# Patient Record
Sex: Female | Born: 1980 | Race: Black or African American | Hispanic: No | Marital: Single | Smoking: Never smoker
Health system: Southern US, Community
[De-identification: ages and names within clinical notes are randomized; demographics above are authoritative.]

## PROBLEM LIST (undated history)

## (undated) DIAGNOSIS — J302 Other seasonal allergic rhinitis: Secondary | ICD-10-CM

## (undated) DIAGNOSIS — M1990 Unspecified osteoarthritis, unspecified site: Secondary | ICD-10-CM

## (undated) HISTORY — PX: SHOULDER SURGERY: SHX246

---

## 1999-01-16 ENCOUNTER — Emergency Department (HOSPITAL_COMMUNITY): Admission: EM | Admit: 1999-01-16 | Discharge: 1999-01-16 | Payer: Self-pay | Admitting: Emergency Medicine

## 1999-06-11 ENCOUNTER — Emergency Department (HOSPITAL_COMMUNITY): Admission: EM | Admit: 1999-06-11 | Discharge: 1999-06-11 | Payer: Self-pay | Admitting: Emergency Medicine

## 1999-07-07 ENCOUNTER — Emergency Department (HOSPITAL_COMMUNITY): Admission: EM | Admit: 1999-07-07 | Discharge: 1999-07-07 | Payer: Self-pay | Admitting: *Deleted

## 2000-08-19 ENCOUNTER — Emergency Department (HOSPITAL_COMMUNITY): Admission: EM | Admit: 2000-08-19 | Discharge: 2000-08-20 | Payer: Self-pay | Admitting: Emergency Medicine

## 2000-08-19 ENCOUNTER — Encounter: Payer: Self-pay | Admitting: Internal Medicine

## 2000-11-26 ENCOUNTER — Emergency Department (HOSPITAL_COMMUNITY): Admission: EM | Admit: 2000-11-26 | Discharge: 2000-11-26 | Payer: Self-pay | Admitting: Emergency Medicine

## 2000-12-21 ENCOUNTER — Ambulatory Visit (HOSPITAL_BASED_OUTPATIENT_CLINIC_OR_DEPARTMENT_OTHER): Admission: RE | Admit: 2000-12-21 | Discharge: 2000-12-22 | Payer: Self-pay | Admitting: Orthopaedic Surgery

## 2001-03-07 ENCOUNTER — Encounter: Admission: RE | Admit: 2001-03-07 | Discharge: 2001-03-23 | Payer: Self-pay | Admitting: Orthopaedic Surgery

## 2001-05-18 ENCOUNTER — Emergency Department (HOSPITAL_COMMUNITY): Admission: EM | Admit: 2001-05-18 | Discharge: 2001-05-18 | Payer: Self-pay | Admitting: Emergency Medicine

## 2001-05-18 ENCOUNTER — Encounter: Payer: Self-pay | Admitting: Emergency Medicine

## 2001-07-29 ENCOUNTER — Encounter: Admission: RE | Admit: 2001-07-29 | Discharge: 2001-08-22 | Payer: Self-pay | Admitting: Orthopaedic Surgery

## 2001-08-10 ENCOUNTER — Other Ambulatory Visit: Admission: RE | Admit: 2001-08-10 | Discharge: 2001-08-10 | Payer: Self-pay | Admitting: Obstetrics and Gynecology

## 2001-09-06 ENCOUNTER — Encounter: Admission: RE | Admit: 2001-09-06 | Discharge: 2001-09-06 | Payer: Self-pay | Admitting: Nephrology

## 2001-09-06 ENCOUNTER — Encounter: Payer: Self-pay | Admitting: Nephrology

## 2001-10-11 ENCOUNTER — Emergency Department (HOSPITAL_COMMUNITY): Admission: EM | Admit: 2001-10-11 | Discharge: 2001-10-11 | Payer: Self-pay | Admitting: Emergency Medicine

## 2001-11-08 ENCOUNTER — Emergency Department (HOSPITAL_COMMUNITY): Admission: EM | Admit: 2001-11-08 | Discharge: 2001-11-09 | Payer: Self-pay | Admitting: Emergency Medicine

## 2001-11-08 ENCOUNTER — Encounter: Payer: Self-pay | Admitting: *Deleted

## 2002-05-29 ENCOUNTER — Emergency Department (HOSPITAL_COMMUNITY): Admission: EM | Admit: 2002-05-29 | Discharge: 2002-05-29 | Payer: Self-pay | Admitting: Emergency Medicine

## 2002-05-29 ENCOUNTER — Encounter: Payer: Self-pay | Admitting: Emergency Medicine

## 2002-06-28 ENCOUNTER — Emergency Department (HOSPITAL_COMMUNITY): Admission: EM | Admit: 2002-06-28 | Discharge: 2002-06-29 | Payer: Self-pay | Admitting: Emergency Medicine

## 2002-06-28 ENCOUNTER — Encounter: Payer: Self-pay | Admitting: Emergency Medicine

## 2002-10-26 ENCOUNTER — Emergency Department (HOSPITAL_COMMUNITY): Admission: EM | Admit: 2002-10-26 | Discharge: 2002-10-26 | Payer: Self-pay | Admitting: Emergency Medicine

## 2002-11-24 ENCOUNTER — Emergency Department (HOSPITAL_COMMUNITY): Admission: EM | Admit: 2002-11-24 | Discharge: 2002-11-24 | Payer: Self-pay | Admitting: Emergency Medicine

## 2003-09-11 ENCOUNTER — Emergency Department (HOSPITAL_COMMUNITY): Admission: EM | Admit: 2003-09-11 | Discharge: 2003-09-11 | Payer: Self-pay | Admitting: *Deleted

## 2003-11-30 ENCOUNTER — Emergency Department (HOSPITAL_COMMUNITY): Admission: EM | Admit: 2003-11-30 | Discharge: 2003-11-30 | Payer: Self-pay

## 2004-09-11 ENCOUNTER — Emergency Department (HOSPITAL_COMMUNITY): Admission: EM | Admit: 2004-09-11 | Discharge: 2004-09-11 | Payer: Self-pay | Admitting: Emergency Medicine

## 2004-11-04 ENCOUNTER — Emergency Department (HOSPITAL_COMMUNITY): Admission: EM | Admit: 2004-11-04 | Discharge: 2004-11-04 | Payer: Self-pay | Admitting: Emergency Medicine

## 2004-12-03 ENCOUNTER — Emergency Department (HOSPITAL_COMMUNITY): Admission: EM | Admit: 2004-12-03 | Discharge: 2004-12-03 | Payer: Self-pay | Admitting: Emergency Medicine

## 2005-12-16 ENCOUNTER — Emergency Department (HOSPITAL_COMMUNITY): Admission: EM | Admit: 2005-12-16 | Discharge: 2005-12-16 | Payer: Self-pay | Admitting: Emergency Medicine

## 2006-01-12 ENCOUNTER — Ambulatory Visit: Payer: Self-pay | Admitting: Gastroenterology

## 2006-02-05 ENCOUNTER — Ambulatory Visit: Payer: Self-pay | Admitting: Gastroenterology

## 2006-02-22 ENCOUNTER — Emergency Department (HOSPITAL_COMMUNITY): Admission: EM | Admit: 2006-02-22 | Discharge: 2006-02-22 | Payer: Self-pay | Admitting: Emergency Medicine

## 2006-05-24 ENCOUNTER — Inpatient Hospital Stay (HOSPITAL_COMMUNITY): Admission: AD | Admit: 2006-05-24 | Discharge: 2006-05-24 | Payer: Self-pay | Admitting: Obstetrics

## 2006-06-01 ENCOUNTER — Ambulatory Visit (HOSPITAL_COMMUNITY): Admission: RE | Admit: 2006-06-01 | Discharge: 2006-06-01 | Payer: Self-pay | Admitting: Obstetrics

## 2007-01-18 ENCOUNTER — Emergency Department (HOSPITAL_COMMUNITY): Admission: EM | Admit: 2007-01-18 | Discharge: 2007-01-18 | Payer: Self-pay | Admitting: Emergency Medicine

## 2007-02-25 ENCOUNTER — Emergency Department (HOSPITAL_COMMUNITY): Admission: EM | Admit: 2007-02-25 | Discharge: 2007-02-25 | Payer: Self-pay | Admitting: Emergency Medicine

## 2007-06-19 ENCOUNTER — Emergency Department (HOSPITAL_COMMUNITY): Admission: EM | Admit: 2007-06-19 | Discharge: 2007-06-19 | Payer: Self-pay | Admitting: Emergency Medicine

## 2007-07-20 ENCOUNTER — Emergency Department (HOSPITAL_COMMUNITY): Admission: EM | Admit: 2007-07-20 | Discharge: 2007-07-20 | Payer: Self-pay | Admitting: Emergency Medicine

## 2007-09-29 ENCOUNTER — Emergency Department (HOSPITAL_COMMUNITY): Admission: EM | Admit: 2007-09-29 | Discharge: 2007-09-29 | Payer: Self-pay | Admitting: Emergency Medicine

## 2007-10-16 ENCOUNTER — Inpatient Hospital Stay (HOSPITAL_COMMUNITY): Admission: AD | Admit: 2007-10-16 | Discharge: 2007-10-17 | Payer: Self-pay | Admitting: Obstetrics & Gynecology

## 2007-10-27 ENCOUNTER — Inpatient Hospital Stay (HOSPITAL_COMMUNITY): Admission: AD | Admit: 2007-10-27 | Discharge: 2007-10-30 | Payer: Self-pay | Admitting: Obstetrics

## 2008-01-02 ENCOUNTER — Ambulatory Visit (HOSPITAL_COMMUNITY): Admission: RE | Admit: 2008-01-02 | Discharge: 2008-01-02 | Payer: Self-pay | Admitting: Obstetrics

## 2008-03-14 ENCOUNTER — Inpatient Hospital Stay (HOSPITAL_COMMUNITY): Admission: AD | Admit: 2008-03-14 | Discharge: 2008-03-14 | Payer: Self-pay | Admitting: Obstetrics

## 2008-04-05 IMAGING — US US OB COMP LESS 14 WK
1 series · 14 of 28 positions shown · non-contrast
Comparison: None.

CLINICAL DATA: 10 weeks pregnant with bleeding for 2 hours.
 OBSTETRICAL ULTRASOUND <14 WKS AND TRANSVAGINAL OB US:
TECHNIQUE: Both transabdominal and transvaginal ultrasound examinations were performed for complete evaluation of the gestation as well as the maternal uterus, adnexal regions, and pelvic cul-de-sac.

[Series 1: us ob comp less 14 wk · 0.26mm/px · 14 of 38 slices shown]
[im 2/38]
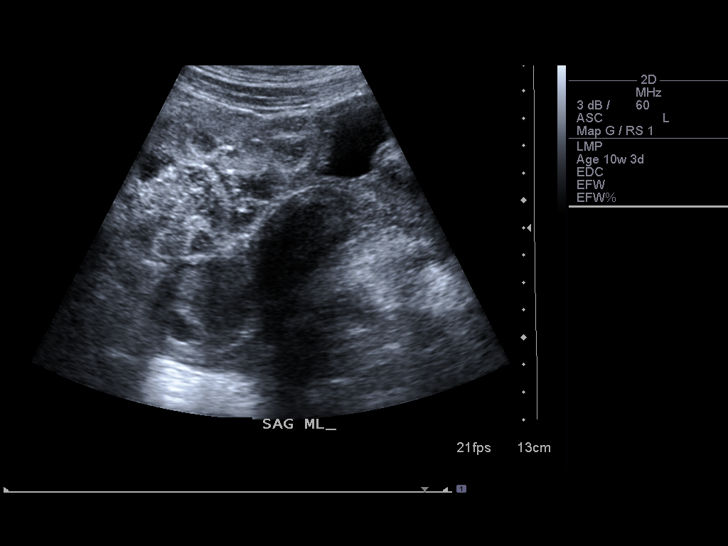
[im 5/38]
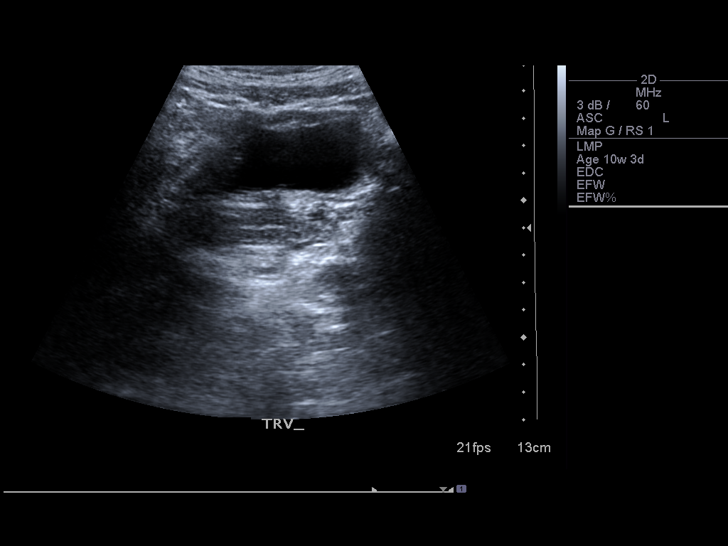
[im 7/38]
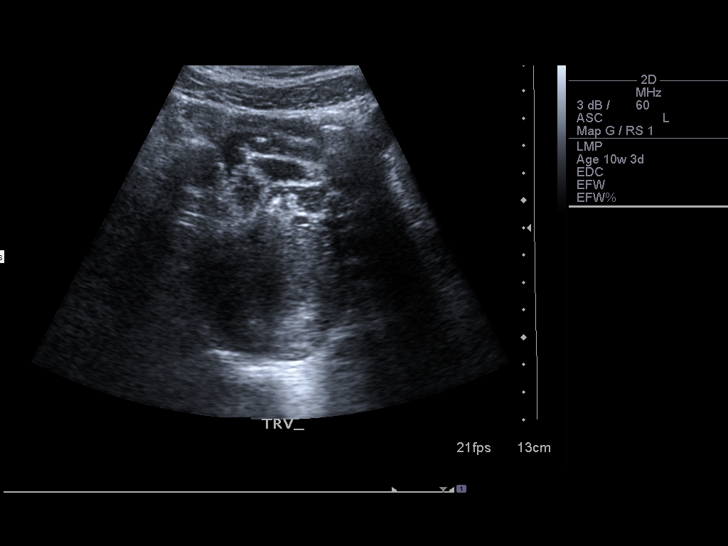
[im 10/38]
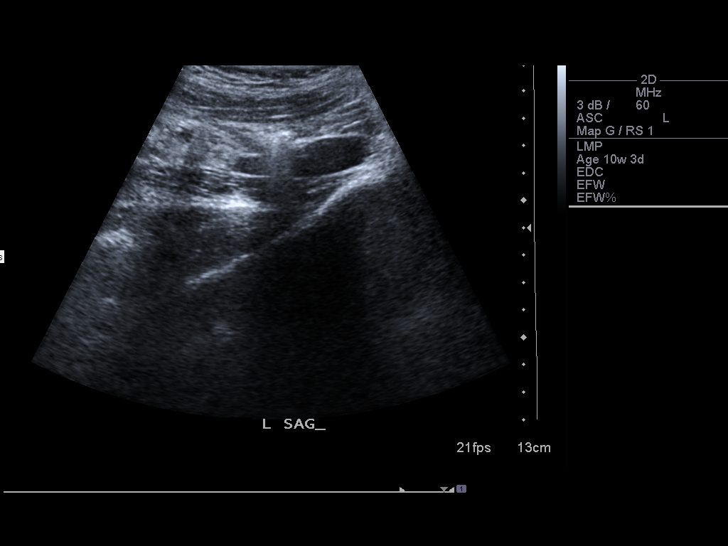
[im 13/38]
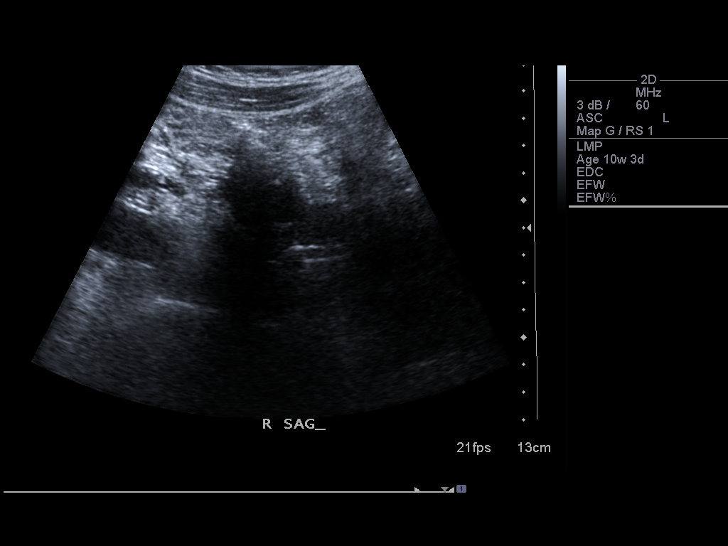
[im 16/38]
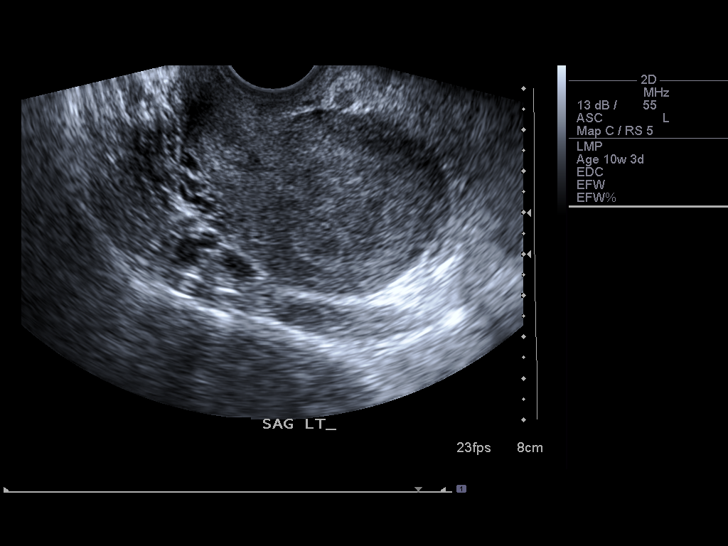
[im 18/38]
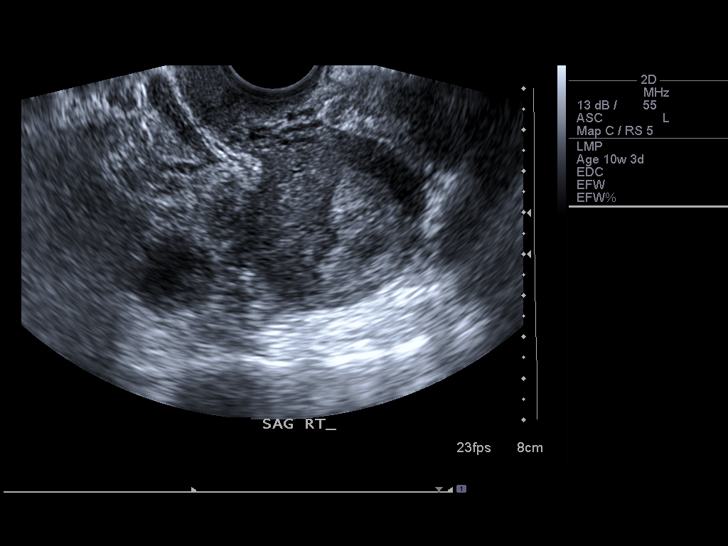
[im 21/38]
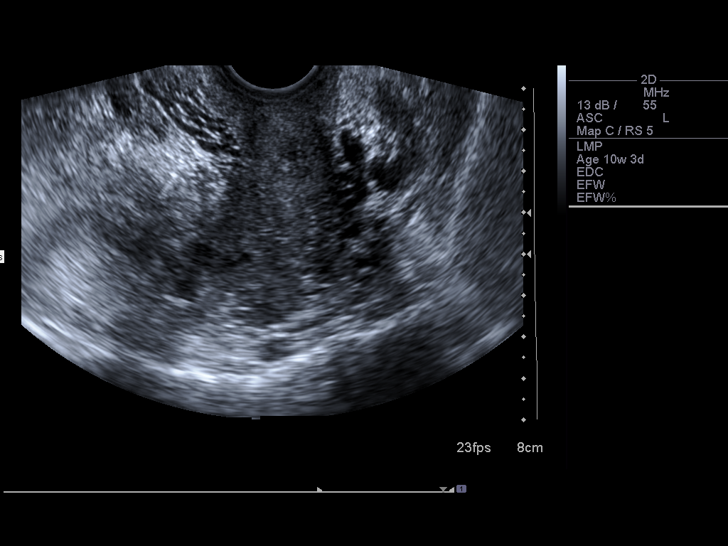
[im 24/38]
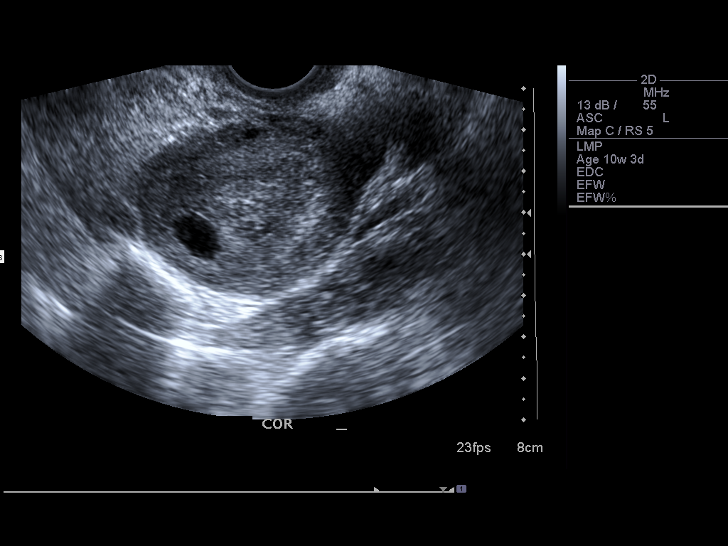
[im 27/38]
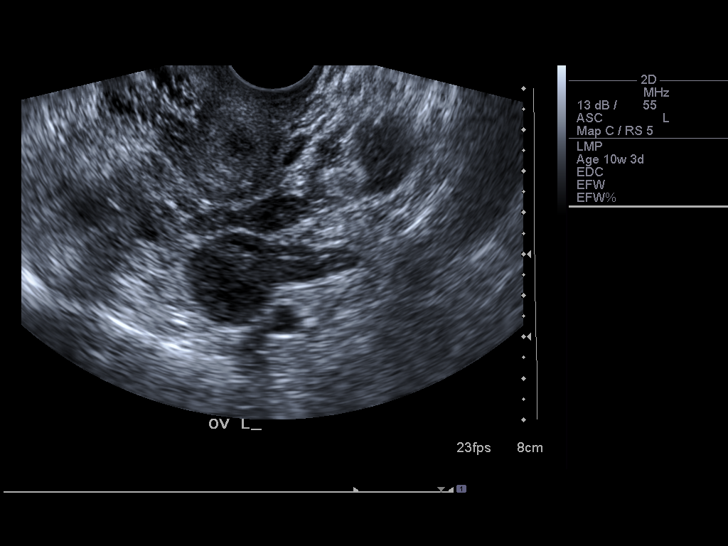
[im 29/38]
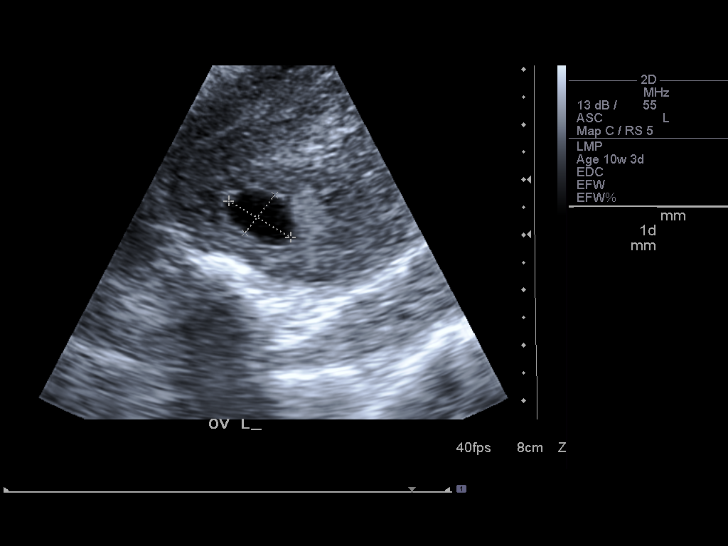
[im 32/38]
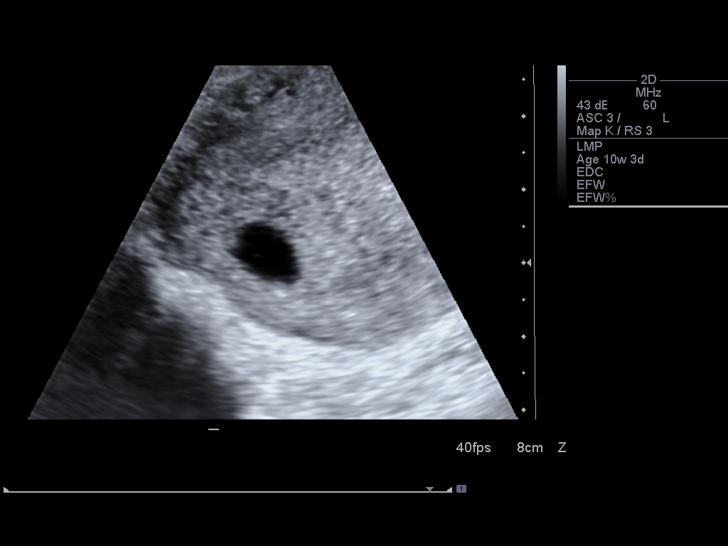
[im 35/38]
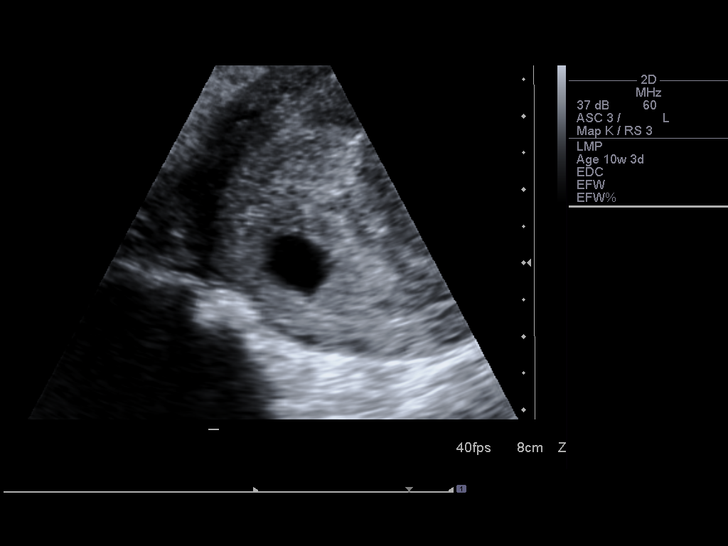
[im 38/38]
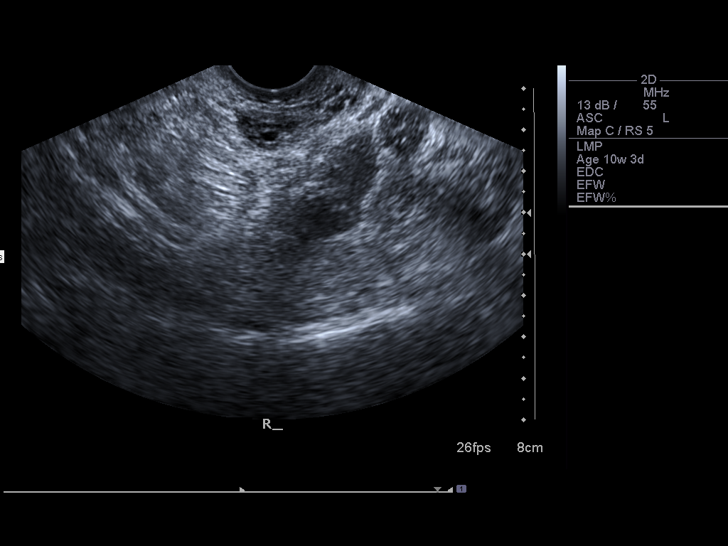

[14 of 28 positions shown; findings below may reference images not displayed]

FINDINGS: The uterus is retroverted but normal in size.  
 An endometrial fluid collection, likely representing a gestational sac is identified.  This measures a mean sac diameter of 10 mm, corresponding to 5 weeks 5 days.  No yolk sac or fetal pole is identified.  There is no significant subchorionic hemorrhage.  
 The right ovary is not identified.  The left ovary measures 3.6 x 2.1 x 1.8 cm and is normal in morphology.  
 No evidence of significant free fluid.
IMPRESSION: Probable gestational sac with a mean sac diameter of 10 mm corresponding to 5 weeks 5 days.  Recommend ultrasound and/or beta hCG follow-up.

## 2008-04-10 ENCOUNTER — Encounter: Payer: Self-pay | Admitting: Obstetrics & Gynecology

## 2008-04-10 ENCOUNTER — Inpatient Hospital Stay (HOSPITAL_COMMUNITY): Admission: AD | Admit: 2008-04-10 | Discharge: 2008-04-16 | Payer: Self-pay | Admitting: Obstetrics & Gynecology

## 2008-05-01 ENCOUNTER — Inpatient Hospital Stay (HOSPITAL_COMMUNITY): Admission: AD | Admit: 2008-05-01 | Discharge: 2008-05-01 | Payer: Self-pay | Admitting: Obstetrics & Gynecology

## 2008-05-07 ENCOUNTER — Inpatient Hospital Stay (HOSPITAL_COMMUNITY): Admission: AD | Admit: 2008-05-07 | Discharge: 2008-05-10 | Payer: Self-pay | Admitting: Obstetrics & Gynecology

## 2008-12-17 ENCOUNTER — Emergency Department (HOSPITAL_COMMUNITY): Admission: EM | Admit: 2008-12-17 | Discharge: 2008-12-17 | Payer: Self-pay | Admitting: Emergency Medicine

## 2009-08-26 ENCOUNTER — Inpatient Hospital Stay (HOSPITAL_COMMUNITY): Admission: AD | Admit: 2009-08-26 | Discharge: 2009-08-26 | Payer: Self-pay | Admitting: Obstetrics

## 2009-10-22 ENCOUNTER — Ambulatory Visit (HOSPITAL_COMMUNITY): Admission: RE | Admit: 2009-10-22 | Discharge: 2009-10-22 | Payer: Self-pay | Admitting: Obstetrics & Gynecology

## 2010-02-22 IMAGING — US US ABDOMEN COMPLETE
2 series · 13 of 25 positions shown · non-contrast
Comparison: None

CLINICAL DATA: Upper quadrant pain.  Nausea and vomiting

ABDOMEN ULTRASOUND
TECHNIQUE: Complete abdominal ultrasound examination was performed
including evaluation of the liver, gallbladder, bile ducts,
pancreas, kidneys, spleen, IVC, and abdominal aorta.

[Series 1: us abdomen complete · 12 of 112 slices shown (1 of 2)]
[im 1/112]
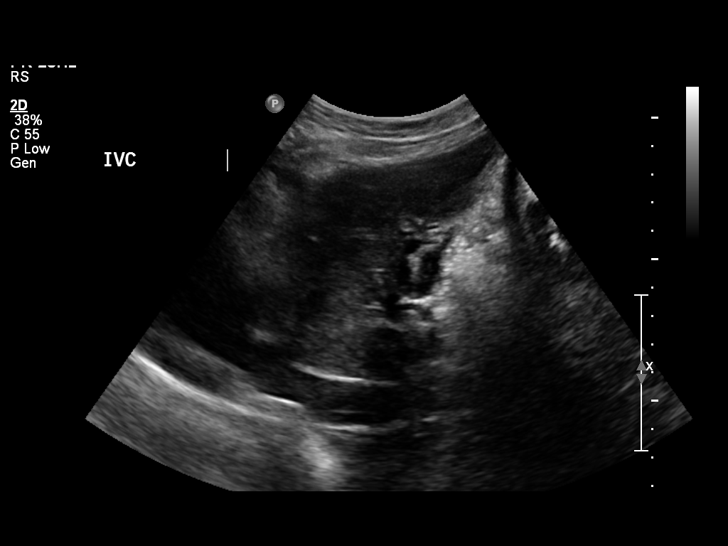
[im 10/112]
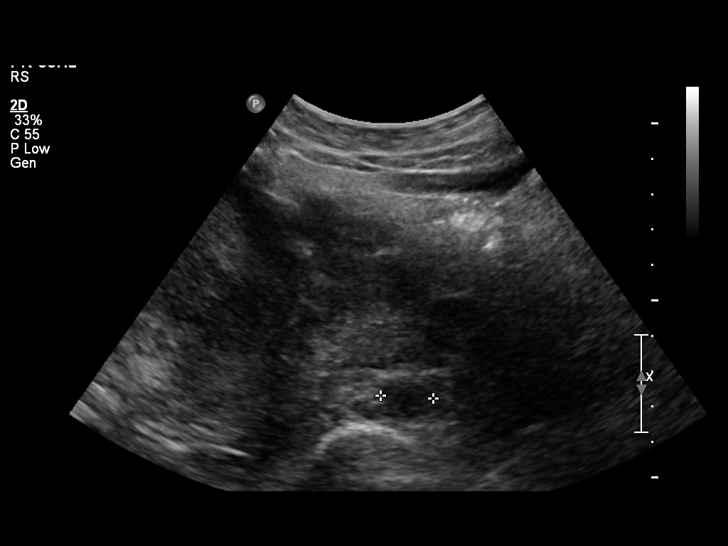
[im 20/112]
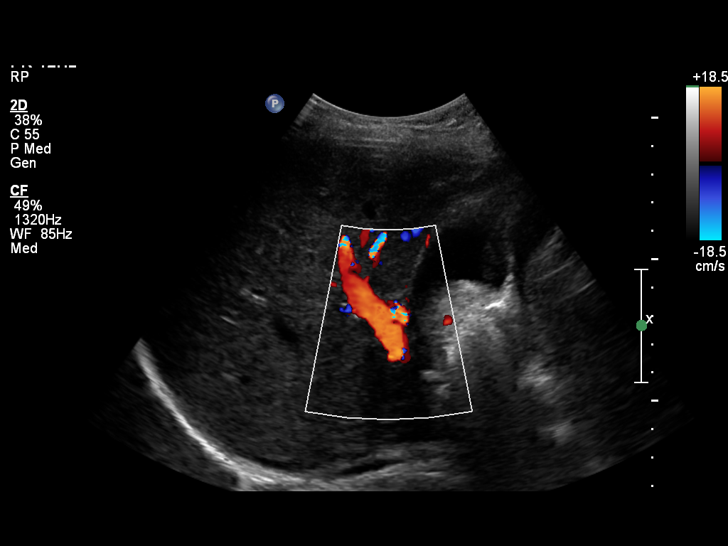
[im 29/112]
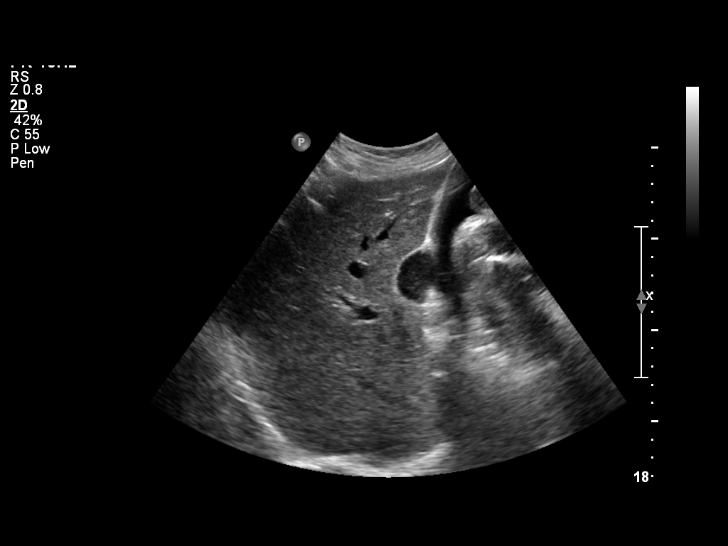
[im 39/112]
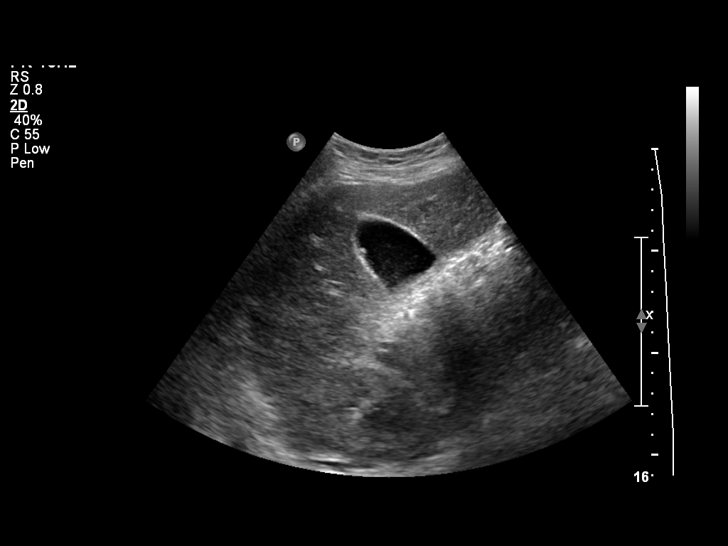
[im 49/112]
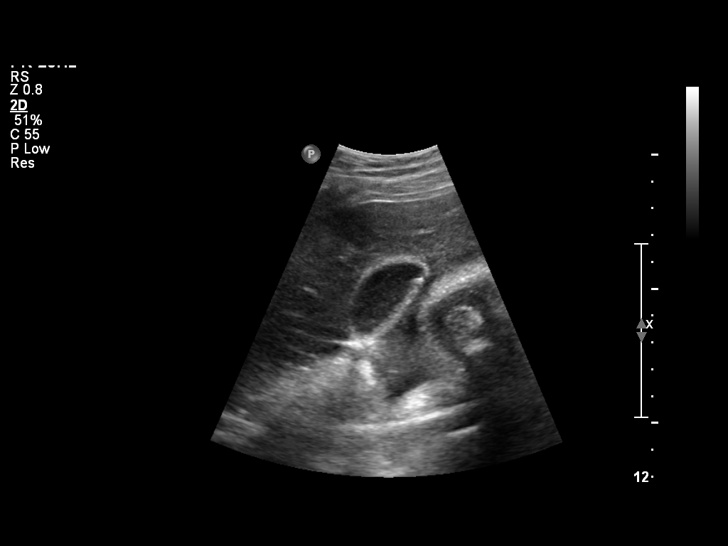
[im 58/112]
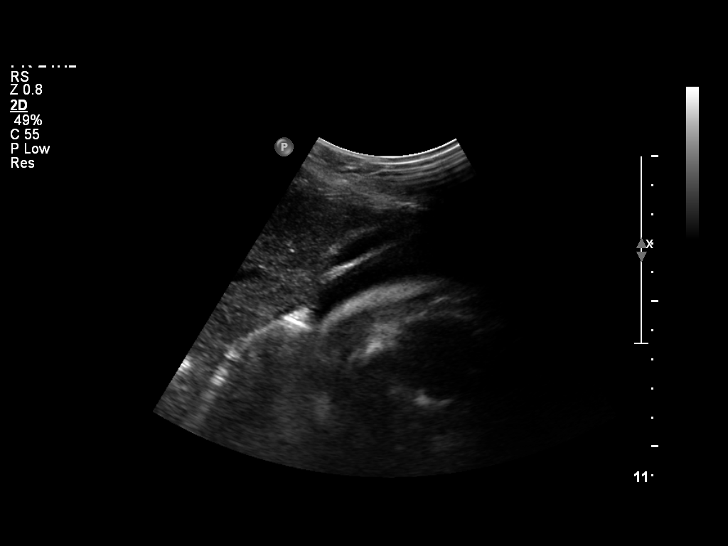
[im 68/112]
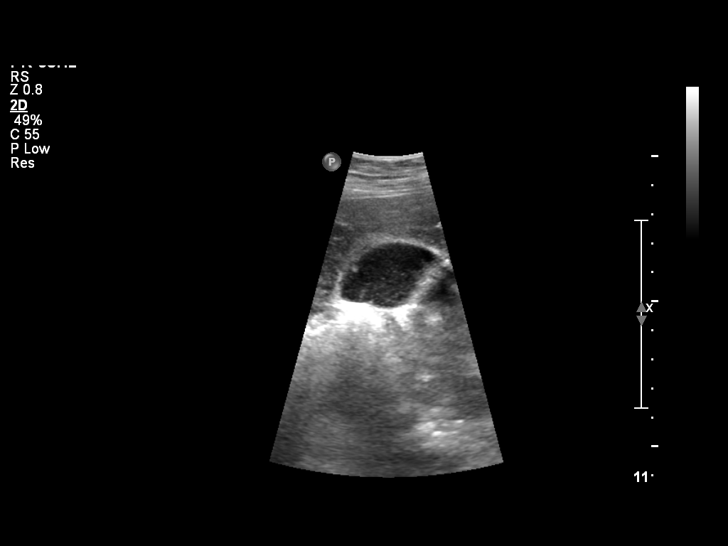
[im 78/112]
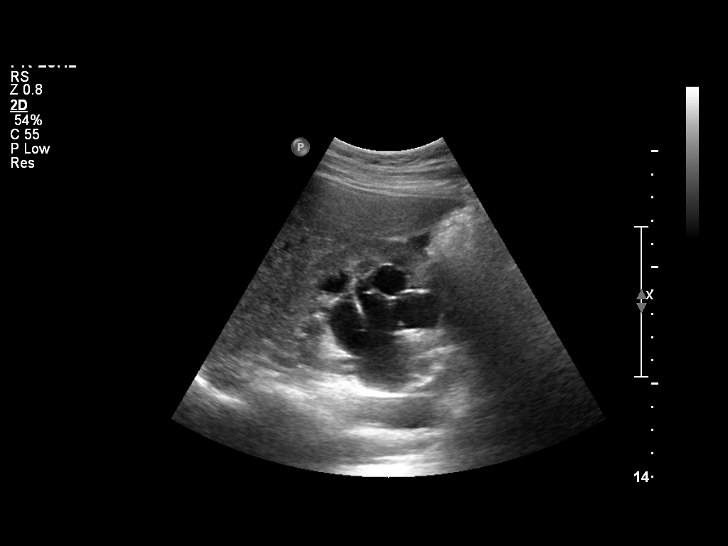
[im 87/112]
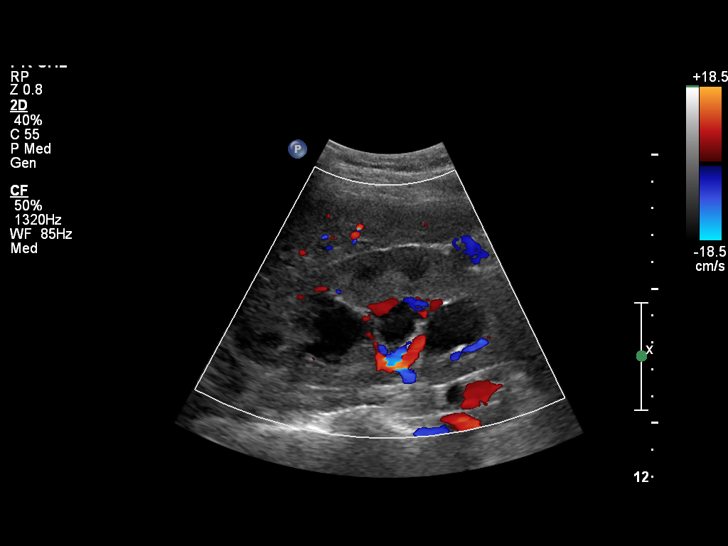
[im 97/112]
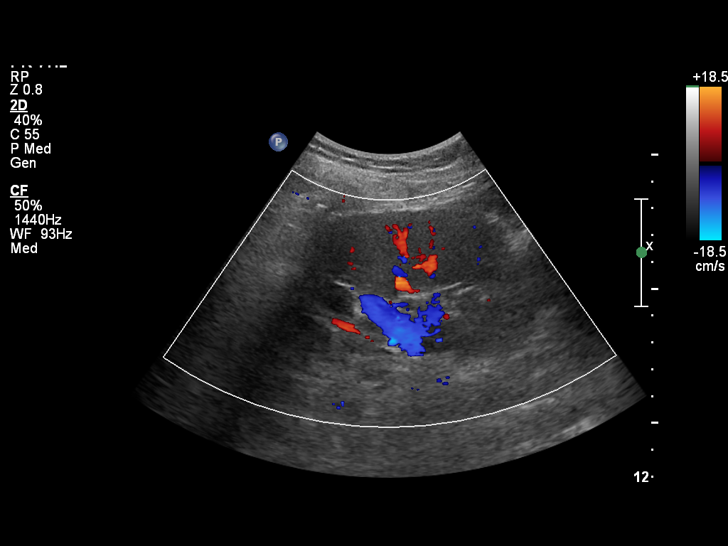
[im 107/112]
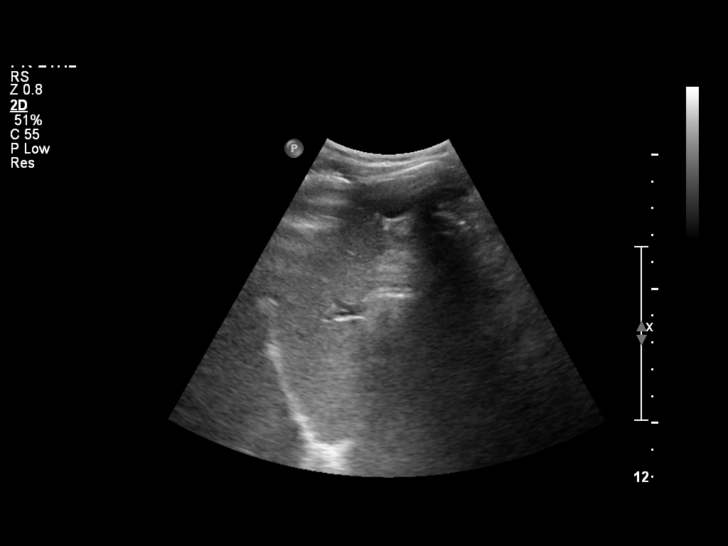

[Series 1: us abdomen complete · 1 of 5 slices shown (2 of 2)]
[im 1/5]
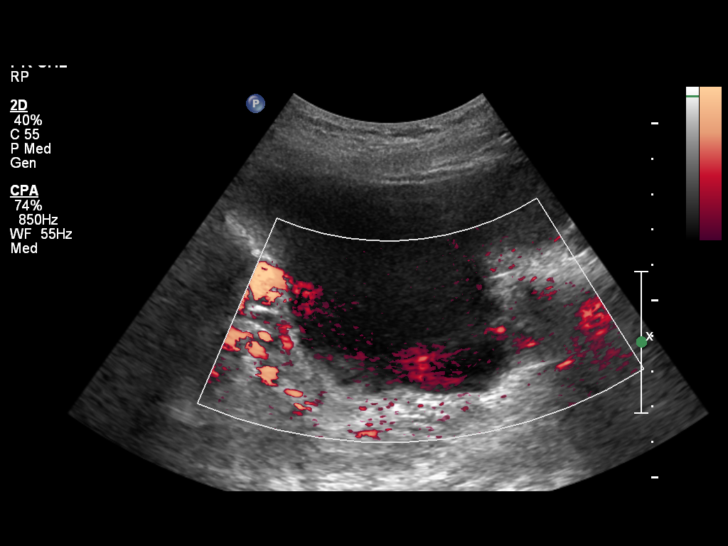

[13 of 25 positions shown; findings below may reference images not displayed]

FINDINGS: The liver parenchyma is homogeneous in echotexture
without evidence for focal parenchymal abnormality or intrahepatic
ductal dilatation.  The gallbladder is well distended and
demonstrates the presence of intraluminal sludge.  At least three
small echogenic polyps are identified, the largest measuring
mm.  The gallbladder wall is not thickened and no signs of
pericholecystic fluid are seen.  The common bile duct has a normal
AP width of 5 mm.

The pancreas is normal in size and echotexture.  The spleen is also
normal in size and echotexture.

The left kidney has a sagittal length of 13 cm and demonstrates
mild pyelectasis compatible the patient's current estimated
gestational age of 33 weeks.  The right kidney demonstrates a
sagittal length of 10.3 cm and demonstrates moderate
pelvicaliectasis.  Dilatation of the proximal aspect of the right
ureter is also seen.  Evaluation of the bladder reveals the
presence of bilateral ureteral jets and this would mitigate against
an obstructive process and suggests that this is related to
hydronephrosis of pregnancy.

The abdominal aorta has a normal appearance with a maximal caliber
of 1.8 cm and the proximal portion of the inferior vena cava is
unremarkable.  No intra-abdominal fluid is seen.
IMPRESSION: Gallbladder sludge with gallbladder polyps as described above.  No
cholelithiasis or signs of cholecystitis are seen sonographically.

Moderate right hydronephrosis with a right ureteral jet confirmed
suggesting hydronephrosis of pregnancy.

## 2010-03-22 ENCOUNTER — Inpatient Hospital Stay (HOSPITAL_COMMUNITY)
Admission: AD | Admit: 2010-03-22 | Discharge: 2010-03-25 | Payer: Self-pay | Source: Home / Self Care | Admitting: Obstetrics & Gynecology

## 2010-06-30 LAB — CBC
HCT: 35.3 % — ABNORMAL LOW (ref 36.0–46.0)
MCH: 29.3 pg (ref 26.0–34.0)
MCHC: 33.4 g/dL (ref 30.0–36.0)
Platelets: 215 10*3/uL (ref 150–400)
Platelets: 241 10*3/uL (ref 150–400)
RBC: 4.03 MIL/uL (ref 3.87–5.11)
RDW: 15.8 % — ABNORMAL HIGH (ref 11.5–15.5)

## 2010-06-30 LAB — RPR: RPR Ser Ql: NONREACTIVE

## 2010-07-08 LAB — URINE CULTURE: Colony Count: 90000

## 2010-07-08 LAB — URINALYSIS, ROUTINE W REFLEX MICROSCOPIC
Bilirubin Urine: NEGATIVE
Nitrite: NEGATIVE
Urobilinogen, UA: 0.2 mg/dL (ref 0.0–1.0)

## 2010-08-04 LAB — CBC
HCT: 31.1 % — ABNORMAL LOW (ref 36.0–46.0)
HCT: 32.3 % — ABNORMAL LOW (ref 36.0–46.0)
Hemoglobin: 10.4 g/dL — ABNORMAL LOW (ref 12.0–15.0)
MCHC: 33.3 g/dL (ref 30.0–36.0)
MCHC: 33.7 g/dL (ref 30.0–36.0)
MCV: 91.2 fL (ref 78.0–100.0)
Platelets: 225 10*3/uL (ref 150–400)
RBC: 3.41 MIL/uL — ABNORMAL LOW (ref 3.87–5.11)
RBC: 3.58 MIL/uL — ABNORMAL LOW (ref 3.87–5.11)
RDW: 14 % (ref 11.5–15.5)
WBC: 9.3 10*3/uL (ref 4.0–10.5)

## 2010-08-04 LAB — COMPREHENSIVE METABOLIC PANEL
AST: 146 U/L — ABNORMAL HIGH (ref 0–37)
Albumin: 2.8 g/dL — ABNORMAL LOW (ref 3.5–5.2)
Alkaline Phosphatase: 172 U/L — ABNORMAL HIGH (ref 39–117)
Alkaline Phosphatase: 225 U/L — ABNORMAL HIGH (ref 39–117)
CO2: 26 mEq/L (ref 19–32)
Calcium: 8.7 mg/dL (ref 8.4–10.5)
Calcium: 8.9 mg/dL (ref 8.4–10.5)
Creatinine, Ser: 0.98 mg/dL (ref 0.4–1.2)
Creatinine, Ser: 1.07 mg/dL (ref 0.4–1.2)
GFR calc non Af Amer: >60 mL/min (ref >60)
Glucose, Bld: 83 mg/dL (ref 70–99)
Potassium: 3.7 mEq/L (ref 3.5–5.1)
Potassium: 4.4 mEq/L (ref 3.5–5.1)
Total Protein: 5.9 g/dL — ABNORMAL LOW (ref 6.0–8.3)

## 2010-08-04 LAB — LACTATE DEHYDROGENASE: LDH: 195 U/L (ref 94–250)

## 2010-08-04 LAB — PROTIME-INR: Prothrombin Time: 12.7 seconds (ref 11.6–15.2)

## 2010-08-04 LAB — RPR: RPR Ser Ql: NONREACTIVE

## 2010-08-04 LAB — URIC ACID: Uric Acid, Serum: 6.1 mg/dL (ref 2.4–7.0)

## 2010-09-02 NOTE — H&P (Signed)
NAME:  Evelyn Olsen, Evelyn Olsen              ACCOUNT NO.:  [id number]   MEDICAL RECORD NO.:  [id number]         PATIENT TYPE:  WINP   LOCATION:                                FACILITY:  [facility]   PHYSICIAN:  Roseanna Rainbow, M.D.DATE OF BIRTH:  1980/10/29   DATE OF ADMISSION:  04/10/2008  DATE OF DISCHARGE:                              HISTORY & PHYSICAL   CHIEF COMPLAINT:  The patient is a 30 year old, para 0, with an  estimated date of confinement of June 01, 2008, with an intrauterine  pregnancy at 32 plus weeks with borderline elevated blood pressures,  nausea and vomiting, and elevated liver transaminases.   HISTORY OF PRESENT ILLNESS:  The patient's blood pressures have been  borderline elevated over the last several visits starting approximately  26-1/2 weeks with blood pressures in the 130s/80s range mostly.  There  was one blood pressure 140/70 on April 04, 2008.  She was recently  seen in the office yesterday and her blood pressure was 135/77.  On the  urine dip, there was no proteinuria.  The patient brought a 24-hour  urine collection and her creatinine clearance was 57; however, the  volume was low at 600 mL.  Her labs were remarkable for SGOT 102 and  SGPT 137 and this was trending upward from 1 week prior when there were  mild elevations in the liver transaminases.  Her hemoglobin was 10.2 and  platelets were 282 and uric acid was 6.7, creatinine was 0.86.  The  patient reports general malaise and nausea.  She also has constipation  and has not moved her bowels for over a week.  She had presented to the  Maternity Admissions Unit in late November with pregnancy-related nausea  and vomiting.   Allergies to PENICILLIN.   OB RISK FACTORS:  Please see the above.   PAST OBSTETRICAL HISTORY:  There is a history of a spontaneous abortion.   PRENATAL LABORATORY DATA:  Please see the above.  Chlamydia probe  negative.  Urine culture and sensitivity insignificant growth.  GC  probe  negative.  One-hour GCT 92.  Hepatitis B surface antigen negative.  Hematocrit 31.2, hemoglobin 10.2.  HIV nonreactive.  Quad screen  negative.  Platelets 262,000.  Blood type is O+, antibody screen  negative.  RPR nonreactive.  Rubella immune.  Sickle cell negative.  An  ultrasound on January 02, 2008, anterior placenta, no previa, normal  amniotic fluid index.  Biometry consistent with an 18-week 3-day  gestation with an [facility] of June 02, 2007, consistent with her last  menstrual period dating normal anatomic survey.   PAST GYNECOLOGIC HISTORY:  Noncontributory.   PAST MEDICAL HISTORY:  No significant history of medical diseases.   PAST SURGICAL HISTORY:  Questionable right upper extremity external  fixation.   SOCIAL HISTORY:  She is unemployed, single, does not give any  significant history of alcohol usage, has no significant smoking  history.  Denies illicit drug use.   FAMILY HISTORY:  Remarkable for hypertension.   REVIEW OF SYSTEMS:  GI:  Please see the above.  She denies any  epigastric  pain.  NEURO:  She denies any headache or visual  disturbances.  PULMONARY:  She denies any shortness of breath.   PHYSICAL EXAMINATION:  VITAL SIGNS:  Performed in the office on April 09, 2008, blood pressure 135/77.  GENERAL:  Well developed in no apparent distress.  ABDOMEN:  Gravid, nontender.  PELVIC:  Deferred.  EXTREMITIES:  Trace to 1+ lower extremity edema.   Doppler fetal heart tones 140.   ASSESSMENT:  Intrauterine pregnancy at 32 plus weeks, rule out severe  preeclampsia.  Differential diagnosis includes possible pregnancy-  related nausea and vomiting with dehydration contraction alkalosis, also  rule out hepatitis.   PLAN:  Admission, we will obtain a consultation from the [facility].  We will start magnesium sulfate for seizure prophylaxis.  We  will also begin a course of steroids.  We will check the 24-hour urine  protein result from the  office on April 09, 2008.  We will also  obtain PT/PTT and acute hepatitis panel.  We will also obtain an  abdominal ultrasound.      Roseanna Rainbow, M.D.  Electronically Signed     LAJ/MEDQ  D:  04/10/2008  T:  04/10/2008  Job:  [id number]

## 2010-09-02 NOTE — Discharge Summary (Signed)
NAME:  Evelyn Olsen, Evelyn Olsen              ACCOUNT NO.:  [id number]   MEDICAL RECORD NO.:  [id number]          PATIENT TYPE:  INP   LOCATION:  9317                          FACILITY:  [facility]   PHYSICIAN:  Charles A. Clearance Coots, M.D.DATE OF BIRTH:  October 08, 1980   DATE OF ADMISSION:  10/27/2007  DATE OF DISCHARGE:  10/30/2007                               DISCHARGE SUMMARY   ADMITTING DIAGNOSES:  8 weeks' gestation, hyperemesis gravidarum.   DISCHARGE DIAGNOSIS:  8 weeks' gestation, hyperemesis gravidarum.   Discharged home at 8 weeks' gestation, undelivered, much improved.  Discharged home in good condition.   REASON FOR ADMISSION:  A 30 year old black female G2, P0, presents with  nausea, vomiting and cramping.  The patient states that she cannot keep  anything down.  Last menstrual period on Aug 26, 2007.   PAST MEDICAL HISTORY:  Surgery: None.  Illness: None.   MEDICATIONS:  Zofran and Tylenol PM.   ALLERGIES:  PENICILLIN.   SOCIAL HISTORY:  Single.  Negative tobacco, alcohol or recreational drug  use.   FAMILY HISTORY:  No major illnesses report.   REVIEW OF SYSTEMS:  Positive for nausea and vomiting, positive for  abdominal cramping.   PHYSICAL EXAMINATION:  A well-nourished, well-developed female, in no  acute distress.  VITAL SIGNS:  Temperature 98, pulse 78, respiratory rate 18, and blood  pressure 136/87.  LUNGS:  Clear to auscultation bilaterally.  HEART:  Regular rate and rhythm.  ABDOMEN:  Nontender.  PELVIC:  Omitted.   ADMITTING LABS:  Hemoglobin 13, hematocrit 40, white blood cell count  11,000, platelets 237,000, sodium 133, potassium 4.7, BUN 13, and  creatinine 0.7.  Liver studies were within normal limits.  Urinalysis,  specific gravity 1.025, moderate hemoglobin, small leukocyte esterase,  many epithelial cells, urine culture was within normal limits.   HOSPITAL COURSE:  The patient was admitted and started on IV fluid  hydration and antiemetic therapy.  She  responded well to treatment and  slowly was able to advance diet, tolerating a regular diet on hospital  day #2.  The patient was discharged home on hospital day #3, tolerating  a regular diet.  No nausea and vomiting.   DISCHARGE DISPOSITION:  Medications, Phenergan 25 mg p.o. q.6 h. p.r.n.  Instructions were given for hyperemesis.  The patient is to call the  office for a followup appointment in 2 weeks.      Charles A. Clearance Coots, M.D.  Electronically Signed     CAH/MEDQ  D:  10/30/2007  T:  10/30/2007  Job:  [id number]

## 2010-09-05 NOTE — Discharge Summary (Signed)
NAME:  Evelyn Olsen, Evelyn Olsen              ACCOUNT NO.:  [id number]   MEDICAL RECORD NO.:  [id number]          PATIENT TYPE:  INP   LOCATION:  9156                          [facility]ACILITY:  [facility]   PHYSICIAN:  Charles A. Clearance Coots, M.D.DATE OF BIRTH:  12/15/1980   DATE OF ADMISSION:  04/10/2008  DATE OF DISCHARGE:  04/16/2008                               DISCHARGE SUMMARY   ADMITTING DIAGNOSES:  32 weeks' gestation, nausea, vomiting,  dehydration.   DISCHARGE DIAGNOSES:  32 weeks' gestation, nausea, vomiting,  dehydration, much improved after IV fluid hydration and antiemetic  therapy.  Discharged home undelivered at 32+ weeks' gestation in good  condition.   REASON FOR ADMISSION:  A 30 year old G1 at 32 weeks' gestation, admitted  due to elevated blood pressure and elevated liver enzymes with severe  nausea, vomiting, and inability to keep anything down.   PAST MEDICAL HISTORY:  SURGERY:  Right shoulder surgery.  ILLNESSES:  None.   MEDICATION:  Zofran and Flintstones vitamin.   ALLERGIES:  PENICILLIN which causes her hives.   SOCIAL HISTORY:  Single.  Negative tobacco, alcohol, or recreational  drug use.   FAMILY HISTORY:  Sister who is mentally challenged with speech and  language deficiency.   PHYSICAL EXAMINATION:  GENERAL:  Well-nourished, well-developed female  in no acute distress.  She is afebrile.  VITAL SIGNS:  Stable.  LUNGS:  Clear to auscultation bilaterally.  HEART:  Regular rate and rhythm.  ABDOMEN:  Gravid, nontender.  Cervical exam was omitted.   ADMITTING LABORATORY DATA:  Hemoglobin was 10, hematocrit 29, white  blood cell count 8900, platelets 243,000.  Comprehensive metabolic panel  was remarkable for AST of 159, ALT of 159.   HOSPITAL COURSE:  The patient was admitted and started on IV fluid  hydration and antiemetic therapy, bedrest.  Ultrasound of the abdomen  and a complete OB ultrasound were obtained and the ultrasound of the  abdomen was within normal  limits with estimated fetal weight at 75th  percentile and no gross anatomic features were noted.  The ultrasound of  the gallbladder revealed gallbladder polyps, no cholelithiasis or signs  of cholecystitis were seen.  The patient continued to respond well to  the therapy and she was ready for discharge on hospital day 5.  The  patient was therefore discharged home on hospital day 5 at 33 weeks'  gestation undelivered in good condition.   DISCHARGE LABORATORY DATA:  Hemoglobin 8.5, hematocrit 24, white blood  cell count 9200, platelets 205,000.   DISCHARGE DISPOSITION:  Medications:  Continue with multivitamins.  We  will try to incorporate iron in her regimen once she is able to tolerate  that.  Routine written instructions were given for discharge with  hyperemesis type of bland diet not eating any spicy foods or foods that  stimulate nausea.  The patient is to call the office for followup  appointment in 2 weeks.      Charles A. Clearance Coots, M.D.  Electronically Signed     CAH/MEDQ  D:  06/22/2008  T:  06/23/2008  Job:  [id number]

## 2010-09-05 NOTE — Op Note (Signed)
. [facility]  Patient:    Evelyn Olsen, Evelyn Olsen Visit Number: [id number] MRN: [id number]          Service Type: DSU Location: [facility] Attending Physician:  Marcene Corning Dictated by:   Lubertha Basque. Jerl Santos, M.D. Proc. Date: 12/21/00 Admit Date:  12/21/2000                             Operative Report  PREOPERATIVE DIAGNOSIS:  Right shoulder instability.  POSTOPERATIVE DIAGNOSIS:  Right shoulder instability.  OPERATION PERFORMED: 1. Right arthroscopic debridement. 2. Right open Bankart repair.  ANESTHESIA:  General.  ATTENDING SURGEON:  Lubertha Basque. Jerl Santos, M.D.  ASSISTANT:  Lindwood Qua, P.A.  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  The procedure was discussed with the patient and informed operative consent was obtained after discussion of possible complications of reaction to anesthesia, infection, neurovascular injury and recurrent dislocation.  DESCRIPTION OF PROCEDURE:  The patient was taken to an operating suite where general anesthetic was applied without difficulty.  She was then positioned supine and prepped and draped in normal sterile fashion.  After administration of preop intravenous antibiotics, an arthroscopy of the right shoulder was performed through a posterior portal.  There were no degenerative changes and the biceps tendon was well attached.  The glenohumeral joint was easily subluxable.  She had no labrum in the anterior aspect consistent with the chronicity of her condition.  The rotator cuff appeared benign on undersurface inspection.  It was felt that I was going to have to do a substantial shift along with her Bankart repair, so I elected to do this in an open fashion. The arthroscopic equipment was removed.  An incision was made on the  anterior aspect of the shoulder with dissection down to the deltopectoral groove.  The cephalic vein was taken with the deltoid in a lateral direction.  Dissection was carried down to the biceps conjoined tendon.  This was taken in a medial direction.  The subscapularis was isolated.  This was cut from the interval down to the level of the vessels which were not incised.  This was tagged with #2 Ethibond suture.  About 1 cm of tissue was left on the lesser tuberosity. At this point the arthroscopic fluid drained from the shoulder.  She had a large bare area on the anteroinferior aspect of her glenoid consistent with her instability.  This was freshened with a bur and rongeur.  I placed two of the minianchors from Arthrex set. These each had Ethibond suture attached. This suture was passed through the capsular tissue in order to make a new labrum.  This was tied down on the anteroinferior aspect of the glenoid at about 3 oclock and 5 oclock positions.  His shoulder then thoroughly irrigated with sterile saline.  I then repaired the subscapularis in a slightly imbricated fashion with the Ethibond suture.  Once this was accomplished she still had about 45 degrees of external rotation with her arm at about 45 degrees of abduction.  The wound was then irrigated followed by reapproximation of subcutaneous tissues with 2-0 undyed Vicryl and skin with nylon.  Steri-Strips were applied followed by Adaptic and dry gauze dressing with tape.  Estimated blood loss and intraoperative fluids can be obtained from Anesthesia records.  DISPOSITION:  The patient was extubated in the operating room and taken to the recovery room in stable condition.  Plans were for her to stay overnight for pain control with probable discharge home in the morning. Dictated by:   Lubertha Basque Jerl Santos, M.D. Attending Physician:  Marcene Corning DD:  12/21/00 TD:  12/21/00 Job: [id number]

## 2010-09-05 NOTE — Assessment & Plan Note (Signed)
[facility]                           GASTROENTEROLOGY OFFICE NOTE   Evelyn Olsen, Evelyn Olsen                       MRN:          [id number]  DATE:01/12/2006                            DOB:          13-Jan-1981    REASON FOR CONSULTATION:  1. Dysphagia.  2. Abdominal pain with constipation.   Evelyn Olsen is a pleasant 30 year old African American female, referred  through the courtesy of Dr. Clearance Coots for evaluation.  She complains of pyrosis  with dysphagia to solids.  She may develop discomfort in her upper abdomen  with her dysphagia.  She also complains of constipation.  She will go 3 to 4  days without a spontaneous bowel movement.  The stools are scant.  She has  abdominal bloating and general discomfort.  There is no history of melena or  hematochezia.   PAST MEDICAL HISTORY:  Unremarkable.   FAMILY HISTORY:  Noncontributory.   MEDICATIONS:  She is on no medications.   ALLERGIES:  PENICILLIN.   She neither smokes nor drinks.  She is single and unemployed.   REVIEW OF SYSTEMS:  Was reviewed and is negative.   PHYSICAL EXAMINATION:  Pulse 66, blood pressure 118/64, weight 168.  HEENT: EOMI. PERRLA. Sclerae are anicteric.  Conjunctivae are pink.  NECK:  Supple without thyromegaly, adenopathy or carotid bruits.  CHEST:  Clear to auscultation and percussion without adventitious sounds.  CARDIAC:  Regular rhythm; normal S1 S2.  There are no murmurs, gallops or  rubs.  ABDOMEN:  Bowel sounds are normoactive.  Abdomen is soft, non-tender and non-  distended.  There are no abdominal masses, tenderness, splenic enlargement  or hepatomegaly.  EXTREMITIES:  Full range of motion.  No cyanosis, clubbing or edema.  RECTAL:  Deferred.   IMPRESSION:  1. Dysphagia - rule out peptic esophageal stricture.  2. Gastroesophageal reflux disease.  3. Constipation.  This is most likely related to irritable bowel syndrome.   RECOMMENDATION:  1. Begin Prevacid  30 mg a day.  2. Upper endoscopy with Savary dilatation as indicated.  3. The patient will consider enrollment in an IBS/constipation trial.      Failing that, I would consider starting her on Lactulose and fiber.                                   Barbette Hair. Arlyce Dice, MD,FACG   RDK/MedQ  DD:  01/12/2006  DT:  01/14/2006  Job #:  [id number]   cc:   Leonette Most A. Clearance Coots, M.D.

## 2011-01-12 LAB — RAPID URINE DRUG SCREEN, HOSP PERFORMED
Amphetamines: NOT DETECTED
Cocaine: NOT DETECTED
Opiates: NOT DETECTED
Tetrahydrocannabinol: NOT DETECTED

## 2011-01-12 LAB — URINALYSIS, ROUTINE W REFLEX MICROSCOPIC
Bilirubin Urine: NEGATIVE
Nitrite: NEGATIVE
Protein, ur: NEGATIVE
Specific Gravity, Urine: 1.008
Urobilinogen, UA: 0.2

## 2011-01-12 LAB — SALICYLATE LEVEL: Salicylate Lvl: <4

## 2011-01-12 LAB — BASIC METABOLIC PANEL
CO2: 24
Chloride: 106
GFR calc Af Amer: >60
Potassium: 3.3 — ABNORMAL LOW

## 2011-01-12 LAB — ETHANOL: Alcohol, Ethyl (B): 27 — ABNORMAL HIGH

## 2011-01-12 LAB — PREGNANCY, URINE: Preg Test, Ur: NEGATIVE

## 2011-01-13 LAB — POCT PREGNANCY, URINE
Operator id: 239701
Preg Test, Ur: NEGATIVE

## 2011-01-15 LAB — CBC
HCT: 38.6
HCT: 40.7
Hemoglobin: 12.1
Hemoglobin: 12.1
Hemoglobin: 13.7
MCHC: 34.4
MCHC: 33.8
MCHC: 33.6
MCHC: 34
MCV: 91.7
MCV: 90.7
MCV: 91.6
Platelets: 240
Platelets: 266
RBC: 3.93
RBC: 4.45
RDW: 13.5
RDW: 13.1
RDW: 13.1
WBC: 11.8 — ABNORMAL HIGH
WBC: 11.2 — ABNORMAL HIGH

## 2011-01-15 LAB — URINE MICROSCOPIC-ADD ON

## 2011-01-15 LAB — COMPREHENSIVE METABOLIC PANEL
ALT: 16
AST: 28
Albumin: 3.7
CO2: 25
CO2: 26
Calcium: 9.2
Calcium: 10.1
Calcium: 9.3
Chloride: 100
Creatinine, Ser: 0.74
Creatinine, Ser: 0.79
GFR calc Af Amer: >60
GFR calc Af Amer: >60
GFR calc non Af Amer: >60
GFR calc non Af Amer: >60
Glucose, Bld: 79
Glucose, Bld: 129 — ABNORMAL HIGH
Glucose, Bld: 79
Sodium: 137
Total Bilirubin: 1
Total Protein: 6.8
Total Protein: 7

## 2011-01-15 LAB — DIFFERENTIAL
Lymphs Abs: 1
Monocytes Relative: 6
Neutro Abs: 6
Neutrophils Relative %: 80 — ABNORMAL HIGH

## 2011-01-15 LAB — URINALYSIS, ROUTINE W REFLEX MICROSCOPIC
Bilirubin Urine: NEGATIVE
Bilirubin Urine: NEGATIVE
Ketones, ur: >80 — AB
Ketones, ur: NEGATIVE
Nitrite: NEGATIVE
Nitrite: NEGATIVE
Nitrite: NEGATIVE
Protein, ur: NEGATIVE
Protein, ur: NEGATIVE
Protein, ur: NEGATIVE
Specific Gravity, Urine: 1.018
Specific Gravity, Urine: 1.025
Urobilinogen, UA: 0.2
Urobilinogen, UA: 0.2
Urobilinogen, UA: 0.2

## 2011-01-15 LAB — BASIC METABOLIC PANEL
CO2: 26
GFR calc non Af Amer: >60
Glucose, Bld: 91
Potassium: 4.2
Sodium: 132 — ABNORMAL LOW

## 2011-01-15 LAB — POCT PREGNANCY, URINE
Operator id: 264031
Preg Test, Ur: POSITIVE

## 2011-01-15 LAB — WET PREP, GENITAL: Trich, Wet Prep: NONE SEEN

## 2011-01-15 LAB — GC/CHLAMYDIA PROBE AMP, GENITAL
Chlamydia, DNA Probe: NEGATIVE
GC Probe Amp, Genital: NEGATIVE

## 2011-01-15 LAB — URINE CULTURE: Colony Count: 10000

## 2011-01-15 LAB — HCG, QUANTITATIVE, PREGNANCY: hCG, Beta Chain, Quant, S: 121289 — ABNORMAL HIGH

## 2011-01-20 LAB — CBC
HCT: 28.9 — ABNORMAL LOW
Hemoglobin: 10 — ABNORMAL LOW
MCHC: 34.5
MCV: 91.2
RBC: 3.17 — ABNORMAL LOW

## 2011-01-20 LAB — COMPREHENSIVE METABOLIC PANEL
ALT: 36 — ABNORMAL HIGH
BUN: 4 — ABNORMAL LOW
CO2: 25
Calcium: 8.4
GFR calc non Af Amer: >60
Glucose, Bld: 78
Sodium: 135

## 2011-01-20 LAB — URINE MICROSCOPIC-ADD ON

## 2011-01-20 LAB — URINE CULTURE: Colony Count: 8000

## 2011-01-20 LAB — URINALYSIS, ROUTINE W REFLEX MICROSCOPIC
Glucose, UA: NEGATIVE
Nitrite: NEGATIVE
Specific Gravity, Urine: <1.005 — ABNORMAL LOW
pH: 6.5

## 2011-01-23 LAB — HEPATIC FUNCTION PANEL
ALT: 145 U/L — ABNORMAL HIGH (ref 0–35)
ALT: 176 U/L — ABNORMAL HIGH (ref 0–35)
ALT: 222 U/L — ABNORMAL HIGH (ref 0–35)
AST: 114 U/L — ABNORMAL HIGH (ref 0–37)
AST: 165 U/L — ABNORMAL HIGH (ref 0–37)
Alkaline Phosphatase: 127 U/L — ABNORMAL HIGH (ref 39–117)
Alkaline Phosphatase: 98 U/L (ref 39–117)
Bilirubin, Direct: 0.1 mg/dL (ref 0.0–0.3)
Bilirubin, Direct: 0.1 mg/dL (ref 0.0–0.3)
Indirect Bilirubin: 0.3 mg/dL (ref 0.3–0.9)
Indirect Bilirubin: 0.3 mg/dL (ref 0.3–0.9)
Indirect Bilirubin: 0.4 mg/dL (ref 0.3–0.9)
Total Bilirubin: 0.4 mg/dL (ref 0.3–1.2)
Total Bilirubin: 0.4 mg/dL (ref 0.3–1.2)
Total Protein: 5.8 g/dL — ABNORMAL LOW (ref 6.0–8.3)
Total Protein: 6.3 g/dL (ref 6.0–8.3)

## 2011-01-23 LAB — PROTIME-INR
Prothrombin Time: 12.7 seconds (ref 11.6–15.2)
Prothrombin Time: 12.9 seconds (ref 11.6–15.2)

## 2011-01-23 LAB — CBC
HCT: 24.7 % — ABNORMAL LOW (ref 36.0–46.0)
HCT: 29.5 % — ABNORMAL LOW (ref 36.0–46.0)
Hemoglobin: 10.1 g/dL — ABNORMAL LOW (ref 12.0–15.0)
Hemoglobin: 8.5 g/dL — ABNORMAL LOW (ref 12.0–15.0)
RBC: 2.7 MIL/uL — ABNORMAL LOW (ref 3.87–5.11)
RDW: 13.1 % (ref 11.5–15.5)
RDW: 13.6 % (ref 11.5–15.5)
WBC: 8.9 10*3/uL (ref 4.0–10.5)

## 2011-01-23 LAB — PROTEIN, URINE, 24 HOUR
Collection Interval-UPROT: 24 hours
Urine Total Volume-UPROT: 4250 mL

## 2011-01-23 LAB — COMPREHENSIVE METABOLIC PANEL
Alkaline Phosphatase: 126 U/L — ABNORMAL HIGH (ref 39–117)
BUN: 7 mg/dL (ref 6–23)
Chloride: 101 mEq/L (ref 96–112)
GFR calc non Af Amer: >60 mL/min (ref >60)
Glucose, Bld: 77 mg/dL (ref 70–99)
Potassium: 3.6 mEq/L (ref 3.5–5.1)
Total Bilirubin: 0.8 mg/dL (ref 0.3–1.2)

## 2011-01-23 LAB — HEPATITIS PANEL, ACUTE: Hep B C IgM: NEGATIVE

## 2011-01-23 LAB — RETICULOCYTES: RBC.: 3.13 MIL/uL — ABNORMAL LOW (ref 3.87–5.11)

## 2011-01-23 LAB — CREATININE CLEARANCE, URINE, 24 HOUR
Creatinine, 24H Ur: 1245 mg/d (ref 700–1800)
Creatinine: 0.92 mg/dL (ref 0.40–1.20)

## 2011-01-23 LAB — DIFFERENTIAL
Basophils Absolute: 0 10*3/uL (ref 0.0–0.1)
Eosinophils Relative: 0 % (ref 0–5)
Lymphocytes Relative: 10 % — ABNORMAL LOW (ref 12–46)
Monocytes Absolute: 1 10*3/uL (ref 0.1–1.0)
Monocytes Relative: 11 % (ref 3–12)
Neutro Abs: 7.2 10*3/uL (ref 1.7–7.7)

## 2011-01-23 LAB — LACTATE DEHYDROGENASE: LDH: 178 U/L (ref 94–250)

## 2011-01-23 LAB — SAMPLE TO BLOOD BANK

## 2011-01-23 LAB — LIPASE, BLOOD: Lipase: 17 U/L (ref 11–59)

## 2011-01-27 LAB — CBC
HCT: 37.7
Hemoglobin: 12.7
MCHC: 33.8
MCV: 89.3
Platelets: 287
RBC: 4.22
RDW: 13.5
WBC: 5.8

## 2011-01-27 LAB — DIFFERENTIAL
Basophils Relative: 1
Eosinophils Absolute: 0.1
Lymphs Abs: 1.4
Neutro Abs: 3.9
Neutrophils Relative %: 67

## 2011-01-27 LAB — WET PREP, GENITAL: Clue Cells Wet Prep HPF POC: NONE SEEN

## 2011-01-27 LAB — GC/CHLAMYDIA PROBE AMP, GENITAL
Chlamydia, DNA Probe: NEGATIVE
GC Probe Amp, Genital: NEGATIVE

## 2011-01-27 LAB — COMPREHENSIVE METABOLIC PANEL WITH GFR
ALT: 14
AST: 18
Albumin: 4.2
Alkaline Phosphatase: 47
BUN: 16
CO2: 30
Calcium: 9.3
Chloride: 108
Creatinine, Ser: 1.02
GFR calc non Af Amer: >60
Glucose, Bld: 88
Potassium: 3.8
Sodium: 142
Total Bilirubin: 0.8
Total Protein: 7.7

## 2011-01-27 LAB — LIPASE, BLOOD: Lipase: 23

## 2011-10-07 ENCOUNTER — Emergency Department (HOSPITAL_COMMUNITY)
Admission: EM | Admit: 2011-10-07 | Discharge: 2011-10-07 | Disposition: A | Discharge status: Home / Self Care | Payer: Medicaid Other | Attending: Emergency Medicine | Admitting: Emergency Medicine

## 2011-10-07 ENCOUNTER — Encounter (HOSPITAL_COMMUNITY): Payer: Self-pay

## 2011-10-07 DIAGNOSIS — Z86718 Personal history of other venous thrombosis and embolism: Secondary | ICD-10-CM | POA: Insufficient documentation

## 2011-10-07 DIAGNOSIS — M79602 Pain in left arm: Secondary | ICD-10-CM

## 2011-10-07 DIAGNOSIS — M7989 Other specified soft tissue disorders: Secondary | ICD-10-CM

## 2011-10-07 DIAGNOSIS — M79609 Pain in unspecified limb: Secondary | ICD-10-CM

## 2011-10-07 MED ORDER — ACETAMINOPHEN 325 MG PO TABS
650.0000 mg | ORAL_TABLET | Freq: Once | ORAL | Status: AC
  Administered 2011-10-07: 650 mg via ORAL
  Filled 2011-10-07: qty 2

## 2011-10-07 MED ORDER — IBUPROFEN 800 MG PO TABS: 800.0000 mg | ORAL_TABLET | Freq: Three times a day (TID) | ORAL | Status: AC

## 2011-10-07 NOTE — ED Provider Notes (Signed)
History   This chart was scribed for Gerhard Munch, MD by Shari Heritage. The patient was seen in room TR06C/TR06C. Patient's care was started at 1523.     CSN: [id number]  Arrival date & time 10/07/11  1523   First MD Initiated Contact with Patient 10/07/11 1641      Chief Complaint  Patient presents with  . Hand Pain  . Arm Injury    (Consider location/radiation/quality/duration/timing/severity/associated sxs/prior treatment) Patient is a 31 y.o. female presenting with extremity pain. The history is provided by the patient. No language interpreter was used.  Extremity Pain This is a new problem. The current episode started more than 1 week ago. The problem occurs constantly. The problem has not changed since onset.The symptoms are aggravated by bending.   Evelyn Olsen is a 31 y.o. female who presents to the Emergency Department complaining of constant, throbbing left hand and arm pain onset 2 weeks ago. Patient says when she makes a fist she feels pain traveling up the length of her arm. When she bends her fingers, she feels fain solely in her hand. Patient denies fever, chills, nausea, vomiting, diarrhea. Patient says she has a history of blood clot in her right knee onset 18 months ago after giving birth. Patient was fitted with a wrap and was ambulatory. Patient says she took prescription blood thinners, but did not have surgery. Patient denies any pertinent medical or surgical history. Patient has never smoked.  History reviewed. No pertinent past medical history.  No past surgical history on file.  No family history on file.  History  Substance Use Topics  . Smoking status: Never Smoker   . Smokeless tobacco: Not on file  . Alcohol Use: No    OB History    Grav Para Term Preterm Abortions TAB SAB Ect Mult Living                  Review of Systems  Constitutional: Negative for fever and chills.       Per HPI, otherwise negative  HENT:       Per HPI, otherwise  negative  Eyes: Negative.   Respiratory:       Per HPI, otherwise negative  Cardiovascular:       Per HPI, otherwise negative  Gastrointestinal: Negative for nausea, vomiting and diarrhea.  Genitourinary: Negative.   Musculoskeletal:       Per HPI, otherwise negative  Skin: Negative.   Neurological: Negative for syncope.    Allergies  Review of patient's allergies indicates no known allergies.  Home Medications   Current Outpatient Rx  Name Route Sig Dispense Refill  . ADULT MULTIVITAMIN W/MINERALS CH Oral Take 1 tablet by mouth daily.    Marland Kitchen NAPROXEN SODIUM 220 MG PO TABS Oral Take 440 mg by mouth daily as needed. For pain    . OLOPATADINE HCL 0.1 % OP SOLN Both Eyes Place 1 drop into both eyes daily.      BP 138/71  Temp 98.8 F (37.1 C) (Oral)  Resp 18  SpO2 98%  Physical Exam  Nursing note and vitals reviewed. Constitutional: She is oriented to person, place, and time. She appears well-developed and well-nourished. No distress.  HENT:  Head: Normocephalic and atraumatic.  Eyes: Conjunctivae and EOM are normal.  Cardiovascular: Normal rate and regular rhythm.   Pulmonary/Chest: Effort normal and breath sounds normal. No stridor. No respiratory distress.  Abdominal: She exhibits no distension.  Musculoskeletal: She exhibits no edema.  Left upper arm: She exhibits tenderness. She exhibits no swelling, no edema, no deformity and no laceration.       Left Arm: Tenderness to palpation in bicep. No overt lesions. Diminished strength in grip secondary to pain. No discoloration. No deformity. No edema. Good pulses and capillary refills.  Neurological: She is alert and oriented to person, place, and time. No cranial nerve deficit.  Skin: Skin is warm and dry.  Psychiatric: She has a normal mood and affect.    ED Course  Procedures (including critical care time)   COORDINATION OF CARE: 4:43PM- Patient informed of current plan for treatment and evaluation and agrees  with plan at this time. Will order pain medication and an ultrasound.  7:49PM- Patient was updated on her case. Ultrasound showed no evidence of a blood clot. Patient is likely experiencing pain from muscle strain. Recommended that patient follow up with her orthopedic doctor. To manage pain, recommended that patient apply ice packs and continue taking anti-inflammatory medication.  Labs Reviewed - No data to display No results found.   No diagnosis found.    MDM  I personally performed the services described in this documentation, which was scribed in my presence. The recorded information has been reviewed and considered.  This generally well-appearing 31 year old female presents with new left arm pain.  On exam she is tenderness in her proximal arm, the patient is concern of blood clots.  The patient does have a history of blood clots during a peri-partum episode.  She is not anti-coagulated, and has no RF for PE /DVT ortherwise.  Given her Hx, ultrasound was performed.  This was negative.  X-rays not performed as there was no history of fall, no trauma or twisting.  Following the negative ultrasound, the patient was discharged in stable condition with instructions to follow up with her orthopedist for continued evaluation of her arm pain.  Gerhard Munch, MD 10/08/11 [phone]

## 2011-10-07 NOTE — ED Notes (Signed)
Left hand and arm pain, swelling and difficulty using it for 2 weeks now.

## 2011-10-07 NOTE — Progress Notes (Signed)
VASCULAR LAB PRELIMINARY  PRELIMINARY  PRELIMINARY  PRELIMINARY  Left upper extremity venous duplex completed.    Preliminary report:  No evidence of right upper extremity DVT or superficial thromboisis  Arnette Driggs, RVS 10/07/2011, 6:54 PM

## 2011-10-07 NOTE — Discharge Instructions (Signed)
As discussed, it is very important that you continue to have your condition managed by your orthopedist and your primary care physician.  Today's evaluation did not demonstrate blood clots, and without a recent fall or trauma, there is very low suspicion of a fracture.  However, there are still many causes of arm pain, and for this reason, you need to be sure to follow-up with your physicians.  Please be sure to return to the ED for any concerning changes in your condition.

## 2012-06-19 ENCOUNTER — Encounter (HOSPITAL_COMMUNITY): Payer: Self-pay | Admitting: Emergency Medicine

## 2012-06-19 ENCOUNTER — Emergency Department (HOSPITAL_COMMUNITY)
Admission: EM | Admit: 2012-06-19 | Discharge: 2012-06-19 | Disposition: A | Discharge status: Home / Self Care | Payer: Medicaid Other | Attending: Emergency Medicine | Admitting: Emergency Medicine

## 2012-06-19 DIAGNOSIS — K029 Dental caries, unspecified: Secondary | ICD-10-CM | POA: Insufficient documentation

## 2012-06-19 DIAGNOSIS — K089 Disorder of teeth and supporting structures, unspecified: Secondary | ICD-10-CM | POA: Insufficient documentation

## 2012-06-19 DIAGNOSIS — K0889 Other specified disorders of teeth and supporting structures: Secondary | ICD-10-CM

## 2012-06-19 MED ORDER — HYDROCODONE-ACETAMINOPHEN 5-325 MG PO TABS
1.0000 | ORAL_TABLET | Freq: Once | ORAL | Status: AC
  Administered 2012-06-19: 1 via ORAL
  Filled 2012-06-19: qty 1

## 2012-06-19 MED ORDER — HYDROCODONE-ACETAMINOPHEN 5-325 MG PO TABS: ORAL_TABLET | ORAL | Status: DC

## 2012-06-19 MED ORDER — NAPROXEN 250 MG PO TABS: 250.0000 mg | ORAL_TABLET | Freq: Two times a day (BID) | ORAL | Status: DC

## 2012-06-19 MED ORDER — CLINDAMYCIN HCL 150 MG PO CAPS: ORAL_CAPSULE | ORAL | Status: DC

## 2012-06-19 NOTE — ED Provider Notes (Signed)
History     CSN: [id number]  Arrival date & time 06/19/12  0845   First MD Initiated Contact with Patient 06/19/12 [phone]      Chief Complaint  Patient presents with  . Dental Pain    HPI Pt was seen at 0915.  Per pt, c/o gradual onset and persistence of constant left lower tooth "pain" for the past several months, worse since yesterday.  Denies fevers, no intra-oral edema, no rash, no facial swelling, no dysphagia, no neck pain.   The condition is aggravated by nothing. The condition is relieved by nothing. The symptoms have been associated with no other complaints. The patient has no significant history of serious medical conditions.     History reviewed. No pertinent past medical history.  Past Surgical History  Procedure Laterality Date  . Shoulder surgery Right     History  Substance Use Topics  . Smoking status: Never Smoker   . Smokeless tobacco: Not on file  . Alcohol Use: No    Review of Systems ROS: Statement: All systems negative except as marked or noted in the HPI; Constitutional: Negative for fever and chills. ; ; Eyes: Negative for eye pain and discharge. ; ; ENMT: Positive for dental caries, dental hygiene poor and toothache. Negative for ear pain, bleeding gums, dental injury, facial deformity, facial swelling, hoarseness, nasal congestion, sinus pressure, sore throat, throat swelling and tongue swollen. ; ; Cardiovascular: Negative for chest pain, palpitations, diaphoresis, dyspnea and peripheral edema. ; ; Respiratory: Negative for cough, wheezing and stridor. ; ; Gastrointestinal: Negative for nausea, vomiting, diarrhea and abdominal pain. ; ; Genitourinary: Negative for dysuria, flank pain and hematuria. ; ; Musculoskeletal: Negative for back pain and neck pain. ; ; Skin: Negative for rash and skin lesion. ; ; Neuro: Negative for headache, lightheadedness and neck stiffness. ;    Allergies  Penicillins  Home Medications   Current Outpatient Rx  Name  Route   Sig  Dispense  Refill  . Multiple Vitamin (MULTIVITAMIN WITH MINERALS) TABS   Oral   Take 1 tablet by mouth daily.         . naproxen sodium (ANAPROX) 220 MG tablet   Oral   Take 440 mg by mouth daily as needed. For pain         . olopatadine (PATANOL) 0.1 % ophthalmic solution   Both Eyes   Place 1 drop into both eyes daily.         Marland Kitchen PRESCRIPTION MEDICATION   Oral   Take 1 tablet by mouth every 6 (six) hours as needed (pain).         . clindamycin (CLEOCIN) 150 MG capsule      3 tabs PO TID x 10 days   90 capsule   0   . HYDROcodone-acetaminophen (NORCO/VICODIN) 5-325 MG per tablet      1 or 2 tabs PO q6 hours prn pain   20 tablet   0   . naproxen (NAPROSYN) 250 MG tablet   Oral   Take 1 tablet (250 mg total) by mouth 2 (two) times daily with a meal.   14 tablet   0     BP 143/92  Pulse 96  Temp(Src) 98 F (36.7 C) (Oral)  Resp 22  SpO2 97%  LMP 06/19/2012  Physical Exam 0920: Physical examination: Vital signs and O2 SAT: Reviewed; Constitutional: Well developed, Well nourished, Well hydrated, In no acute distress; Head and Face: Normocephalic, Atraumatic; Eyes: EOMI, PERRL, No  scleral icterus; ENMT: Mouth and pharynx normal, Poor dentition, Widespread dental decay, Left TM normal, Right TM normal, Mucous membranes moist, +lower left canine, 1st and 2nd premolars with dental decay.  No gingival erythema, edema, fluctuance, or drainage.  No intra-oral edema. No submandibular or sublingual edema. No hoarse voice, no drooling, no stridor.  ; Neck: Supple, Full range of motion, No lymphadenopathy; Cardiovascular: Regular rate and rhythm, No murmur, rub, or gallop; Respiratory: Breath sounds clear & equal bilaterally, No rales, rhonchi, wheezes, Normal respiratory effort/excursion; Chest: Nontender, Movement normal; Extremities: Pulses normal, No tenderness, No edema; Neuro: AA&Ox3, Major CN grossly intact.  No gross focal motor or sensory deficits in extremities.;  Skin: Color normal, No rash, No petechiae, Warm, Dry   ED Course  Procedures     MDM  MDM Reviewed: previous chart, nursing note and vitals     0925:  Pt encouraged to f/u with dentist or oral surgeon for her dental needs for good continuity of care and definitive treatment.  Verb understanding.         Laray Anger, DO 06/21/12 1129

## 2012-06-19 NOTE — ED Notes (Signed)
Pt c/o left lower toothache onset yesterday. Pt crying in triage. Pt took excedrin at 0500 without relief.

## 2013-03-31 ENCOUNTER — Inpatient Hospital Stay (HOSPITAL_COMMUNITY)
Admission: AD | Admit: 2013-03-31 | Discharge: 2013-03-31 | Disposition: A | Discharge status: Home / Self Care | Payer: Medicaid Other | Source: Ambulatory Visit | Attending: Obstetrics & Gynecology | Admitting: Obstetrics & Gynecology

## 2013-03-31 ENCOUNTER — Inpatient Hospital Stay (HOSPITAL_COMMUNITY): Payer: Medicaid Other

## 2013-03-31 ENCOUNTER — Encounter (HOSPITAL_COMMUNITY): Payer: Self-pay | Admitting: General Practice

## 2013-03-31 DIAGNOSIS — A084 Viral intestinal infection, unspecified: Secondary | ICD-10-CM

## 2013-03-31 DIAGNOSIS — R944 Abnormal results of kidney function studies: Secondary | ICD-10-CM | POA: Insufficient documentation

## 2013-03-31 DIAGNOSIS — I10 Essential (primary) hypertension: Secondary | ICD-10-CM | POA: Diagnosis present

## 2013-03-31 DIAGNOSIS — R109 Unspecified abdominal pain: Secondary | ICD-10-CM | POA: Insufficient documentation

## 2013-03-31 DIAGNOSIS — A088 Other specified intestinal infections: Secondary | ICD-10-CM

## 2013-03-31 DIAGNOSIS — R110 Nausea: Secondary | ICD-10-CM | POA: Insufficient documentation

## 2013-03-31 LAB — CBC
HCT: 35.9 % — ABNORMAL LOW (ref 36.0–46.0)
Hemoglobin: 12 g/dL (ref 12.0–15.0)
MCH: 28.4 pg (ref 26.0–34.0)
MCHC: 33.4 g/dL (ref 30.0–36.0)
MCV: 85.1 fL (ref 78.0–100.0)
RBC: 4.22 MIL/uL (ref 3.87–5.11)
RDW: 14 % (ref 11.5–15.5)

## 2013-03-31 LAB — COMPREHENSIVE METABOLIC PANEL
Albumin: 3.6 g/dL (ref 3.5–5.2)
BUN: 19 mg/dL (ref 6–23)
Calcium: 9.6 mg/dL (ref 8.4–10.5)
Chloride: 99 mEq/L (ref 96–112)
Creatinine, Ser: 1.17 mg/dL — ABNORMAL HIGH (ref 0.50–1.10)
GFR calc Af Amer: 70 mL/min — ABNORMAL LOW (ref >90)
Glucose, Bld: 88 mg/dL (ref 70–99)
Total Protein: 7.5 g/dL (ref 6.0–8.3)

## 2013-03-31 LAB — URINALYSIS, ROUTINE W REFLEX MICROSCOPIC
Leukocytes, UA: NEGATIVE
Nitrite: NEGATIVE
Specific Gravity, Urine: 1.02 (ref 1.005–1.030)
Urobilinogen, UA: 0.2 mg/dL (ref 0.0–1.0)
pH: 8 (ref 5.0–8.0)

## 2013-03-31 LAB — WET PREP, GENITAL: Yeast Wet Prep HPF POC: NONE SEEN

## 2013-03-31 LAB — URINE MICROSCOPIC-ADD ON

## 2013-03-31 LAB — POCT PREGNANCY, URINE: Preg Test, Ur: NEGATIVE

## 2013-03-31 MED ORDER — PROMETHAZINE HCL 25 MG/ML IJ SOLN
12.5000 mg | Freq: Once | INTRAMUSCULAR | Status: AC
  Administered 2013-03-31: 12.5 mg via INTRAVENOUS
  Filled 2013-03-31: qty 1

## 2013-03-31 MED ORDER — SODIUM CHLORIDE 0.9 % IV SOLN
INTRAVENOUS | Status: DC
  Administered 2013-03-31: 21:00:00 via INTRAVENOUS

## 2013-03-31 MED ORDER — HYDROMORPHONE HCL PF 1 MG/ML IJ SOLN
0.5000 mg | Freq: Once | INTRAMUSCULAR | Status: AC
  Administered 2013-03-31: 0.5 mg via INTRAVENOUS
  Filled 2013-03-31: qty 1

## 2013-03-31 MED ORDER — PROMETHAZINE HCL 25 MG PO TABS: 25.0000 mg | ORAL_TABLET | Freq: Four times a day (QID) | ORAL | Status: DC | PRN

## 2013-03-31 NOTE — MAU Note (Signed)
Pt presents to MAU via stretcher by EMS with c/o abdominal cramping for 4 days. Pt states she has taken tylenol but it is not working. Pt also states abdominal pressure like something is leaning against her belly and leaking a discharge,

## 2013-03-31 NOTE — MAU Provider Note (Signed)
History     CSN: [id number]  Arrival date and time: 03/31/13 2010   First Provider Initiated Contact with Patient 03/31/13 2029      Chief Complaint  Patient presents with  . Abdominal Pain   HPI This is a 32 y.o. female who presents with abdominal cramping for several days. Moves around abdomen, not focal. Has had nausea but no vomiting. No diarrhea. Several family members have had gastroenteritis. Does not think she had a fever but has no thermometer.   RN Note: Pt presents to MAU via stretcher by EMS with c/o abdominal cramping for 4 days. Pt states she has taken tylenol but it is not working. Pt also states abdominal pressure like something is leaning against her belly and leaking a discharge,       OB History   Grav Para Term Preterm Abortions TAB SAB Ect Mult Living   3 2 2  1  1   2       History reviewed. No pertinent past medical history.  Past Surgical History  Procedure Laterality Date  . Shoulder surgery Right     History reviewed. No pertinent family history.  History  Substance Use Topics  . Smoking status: Never Smoker   . Smokeless tobacco: Not on file  . Alcohol Use: No    Allergies:  Allergies  Allergen Reactions  . Penicillins Hives    Prescriptions prior to admission  Medication Sig Dispense Refill  . acetaminophen (TYLENOL) 325 MG tablet Take 650 mg by mouth every 6 (six) hours as needed for moderate pain.      . Misc Natural Products (COLON CLEANSE PO) Take 2 tablets by mouth 2 (two) times daily.      . Multiple Vitamin (MULTIVITAMIN WITH MINERALS) TABS Take 1 tablet by mouth daily.        Review of Systems  Constitutional: Positive for malaise/fatigue. Negative for fever and chills.  Respiratory: Negative for cough.   Gastrointestinal: Positive for nausea and abdominal pain. Negative for vomiting, diarrhea and constipation.  Genitourinary: Negative for dysuria.  Musculoskeletal: Positive for back pain. Negative for myalgias.   Neurological: Negative for dizziness, focal weakness and headaches.   Physical Exam   Blood pressure 141/92, pulse 69, temperature 98.3 F (36.8 C), resp. rate 20, height 5\' 11"  (1.803 m), weight 160 lb (72.576 kg), last menstrual period 03/04/2013.  Physical Exam  Constitutional: She is oriented to person, place, and time. She appears well-developed and well-nourished. No distress (but uncomfortable).  HENT:  Head: Normocephalic.  Cardiovascular: Normal rate.   Respiratory: Effort normal.  GI: Soft. She exhibits no distension and no mass. There is tenderness (diffuse throughout). There is no rebound and no guarding.  Pain does not localize in any one area Entire back sore, but no laterality noted No overt CVAT noted with exam, but "whole back hurts"  Genitourinary: Uterus normal. Vaginal discharge (thick white) found.  No focal adnexal tenderness Cultures obtained   Musculoskeletal: Normal range of motion.  Neurological: She is alert and oriented to person, place, and time.  Skin: Skin is warm and dry.  Psychiatric: She has a normal mood and affect.    MAU Course  Procedures  MDM Results for orders placed during the hospital encounter of 03/31/13 (from the past 24 hour(s))  URINALYSIS, ROUTINE W REFLEX MICROSCOPIC     Status: Abnormal   Collection Time    03/31/13  8:29 PM      Result Value Range   Color, Urine  YELLOW  YELLOW   APPearance CLEAR  CLEAR   Specific Gravity, Urine 1.020  1.005 - 1.030   pH 8.0  5.0 - 8.0   Glucose, UA NEGATIVE  NEGATIVE mg/dL   Hgb urine dipstick MODERATE (*) NEGATIVE   Bilirubin Urine NEGATIVE  NEGATIVE   Ketones, ur NEGATIVE  NEGATIVE mg/dL   Protein, ur NEGATIVE  NEGATIVE mg/dL   Urobilinogen, UA 0.2  0.0 - 1.0 mg/dL   Nitrite NEGATIVE  NEGATIVE   Leukocytes, UA NEGATIVE  NEGATIVE  URINE MICROSCOPIC-ADD ON     Status: Abnormal   Collection Time    03/31/13  8:29 PM      Result Value Range   Squamous Epithelial / LPF RARE  RARE    WBC, UA 0-2  <3 WBC/hpf   RBC / HPF 3-6  <3 RBC/hpf   Bacteria, UA FEW (*) RARE  POCT PREGNANCY, URINE     Status: None   Collection Time    03/31/13  8:38 PM      Result Value Range   Preg Test, Ur NEGATIVE  NEGATIVE  WET PREP, GENITAL     Status: None   Collection Time    03/31/13  8:40 PM      Result Value Range   Yeast Wet Prep HPF POC NONE SEEN  NONE SEEN   Trich, Wet Prep NONE SEEN  NONE SEEN   Clue Cells Wet Prep HPF POC NONE SEEN  NONE SEEN   WBC, Wet Prep HPF POC NONE SEEN  NONE SEEN  CBC     Status: Abnormal   Collection Time    03/31/13  9:08 PM      Result Value Range   WBC 6.9  4.0 - 10.5 K/uL   RBC 4.22  3.87 - 5.11 MIL/uL   Hemoglobin 12.0  12.0 - 15.0 g/dL   HCT 16.1 (*) 09.6 - 04.5 %   MCV 85.1  78.0 - 100.0 fL   MCH 28.4  26.0 - 34.0 pg   MCHC 33.4  30.0 - 36.0 g/dL   RDW 40.9  81.1 - 91.4 %   Platelets 196  150 - 400 K/uL  COMPREHENSIVE METABOLIC PANEL     Status: Abnormal   Collection Time    03/31/13  9:08 PM      Result Value Range   Sodium 137  135 - 145 mEq/L   Potassium 4.4  3.5 - 5.1 mEq/L   Chloride 99  96 - 112 mEq/L   CO2 28  19 - 32 mEq/L   Glucose, Bld 88  70 - 99 mg/dL   BUN 19  6 - 23 mg/dL   Creatinine, Ser 7.82 (*) 0.50 - 1.10 mg/dL   Calcium 9.6  8.4 - 95.6 mg/dL   Total Protein 7.5  6.0 - 8.3 g/dL   Albumin 3.6  3.5 - 5.2 g/dL   AST 18  0 - 37 U/L   ALT 19  0 - 35 U/L   Alkaline Phosphatase 59  39 - 117 U/L   Total Bilirubin 0.2 (*) 0.3 - 1.2 mg/dL   GFR calc non Af Amer 60 (*) >90 mL/min   GFR calc Af Amer 70 (*) >90 mL/min    IV hydration given.  Good relief from Dilaudid and Phenergan IV   Assessment and Plan  A:  Probable viral gastroenteritis      Slight elevation in creatinine with decreased GFR.  P:  Discussed findings/results  PO hydration at home      Rx Phenergan      Warnings to return to ED for increased pain, fever over 100.5, intractable vomiting, bloody diarrhea, etc      Discussed abnormal renal  findings.  Instructed to call her family doctor (she has one) in AM to assure followup of that      Cultures sent   [facility] 03/31/2013, 10:42 PM

## 2013-04-01 LAB — GC/CHLAMYDIA PROBE AMP
CT Probe RNA: NEGATIVE
GC Probe RNA: NEGATIVE

## 2013-06-02 ENCOUNTER — Ambulatory Visit: Payer: Self-pay | Admitting: Obstetrics & Gynecology

## 2013-06-14 ENCOUNTER — Ambulatory Visit: Payer: Self-pay | Admitting: Obstetrics & Gynecology

## 2013-09-03 ENCOUNTER — Emergency Department (HOSPITAL_COMMUNITY)
Admission: EM | Admit: 2013-09-03 | Discharge: 2013-09-03 | Disposition: A | Discharge status: Home / Self Care | Payer: Medicaid Other | Attending: Emergency Medicine | Admitting: Emergency Medicine

## 2013-09-03 ENCOUNTER — Emergency Department (HOSPITAL_COMMUNITY): Payer: Medicaid Other

## 2013-09-03 ENCOUNTER — Encounter (HOSPITAL_COMMUNITY): Payer: Self-pay | Admitting: Emergency Medicine

## 2013-09-03 DIAGNOSIS — M25539 Pain in unspecified wrist: Secondary | ICD-10-CM | POA: Insufficient documentation

## 2013-09-03 DIAGNOSIS — M25519 Pain in unspecified shoulder: Secondary | ICD-10-CM | POA: Insufficient documentation

## 2013-09-03 DIAGNOSIS — Z79899 Other long term (current) drug therapy: Secondary | ICD-10-CM | POA: Insufficient documentation

## 2013-09-03 DIAGNOSIS — M7989 Other specified soft tissue disorders: Secondary | ICD-10-CM | POA: Insufficient documentation

## 2013-09-03 DIAGNOSIS — M25529 Pain in unspecified elbow: Secondary | ICD-10-CM | POA: Insufficient documentation

## 2013-09-03 DIAGNOSIS — M129 Arthropathy, unspecified: Secondary | ICD-10-CM | POA: Insufficient documentation

## 2013-09-03 DIAGNOSIS — Z880 Allergy status to penicillin: Secondary | ICD-10-CM | POA: Insufficient documentation

## 2013-09-03 DIAGNOSIS — M79602 Pain in left arm: Secondary | ICD-10-CM

## 2013-09-03 HISTORY — DX: Other seasonal allergic rhinitis: J30.2

## 2013-09-03 HISTORY — DX: Unspecified osteoarthritis, unspecified site: M19.90

## 2013-09-03 MED ORDER — IBUPROFEN 800 MG PO TABS
800.0000 mg | ORAL_TABLET | Freq: Once | ORAL | Status: AC
  Administered 2013-09-03: 800 mg via ORAL
  Filled 2013-09-03: qty 1

## 2013-09-03 MED ORDER — ETODOLAC 500 MG PO TABS: 500.0000 mg | ORAL_TABLET | Freq: Two times a day (BID) | ORAL | Status: DC

## 2013-09-03 NOTE — Discharge Instructions (Signed)
Tendinitis °Tendinitis is swelling and inflammation of the tendons. Tendons are band-like tissues that connect muscle to bone. Tendinitis commonly occurs in the:  °· Shoulders (rotator cuff). °· Heels (Achilles tendon). °· Elbows (triceps tendon). °CAUSES °Tendinitis is usually caused by overusing the tendon, muscles, and joints involved. When the tissue surrounding a tendon (synovium) becomes inflamed, it is called tenosynovitis. Tendinitis commonly develops in people whose jobs require repetitive motions. °SYMPTOMS °· Pain. °· Tenderness. °· Mild swelling. °DIAGNOSIS °Tendinitis is usually diagnosed by physical exam. Your caregiver may also order X-rays or other imaging tests. °TREATMENT °Your caregiver may recommend certain medicines or exercises for your treatment. °HOME CARE INSTRUCTIONS  °· Use a sling or splint for as long as directed by your caregiver until the pain decreases. °· Put ice on the injured area. °· Put ice in a plastic bag. °· Place a towel between your skin and the bag. °· Leave the ice on for 15-20 minutes, 03-04 times a day. °· Avoid using the limb while the tendon is painful. Perform gentle range of motion exercises only as directed by your caregiver. Stop exercises if pain or discomfort increase, unless directed otherwise by your caregiver. °· Only take over-the-counter or prescription medicines for pain, discomfort, or fever as directed by your caregiver. °SEEK MEDICAL CARE IF:  °· Your pain and swelling increase. °· You develop new, unexplained symptoms, especially increased numbness in the hands. °MAKE SURE YOU:  °· Understand these instructions. °· Will watch your condition. °· Will get help right away if you are not doing well or get worse. °Document Released: 04/03/2000 Document Revised: 06/29/2011 Document Reviewed: 06/23/2010 °[employer]® Patient Information ©2014 [employer]. ° °

## 2013-09-03 NOTE — ED Provider Notes (Signed)
CSN: [id number]     Arrival date & time 09/03/13  [id number] History   First MD Initiated Contact with Patient 09/03/13 0932     Chief Complaint  Patient presents with  . Arm Pain  . Arm Swelling    HPI Pt has felt like her arm has been swollen for three weeks.  She has pain from her shoulder down.  No known injuries.  Movement increases the pain.  No numbness.   The pain is sharp and throbbing.  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 Past Medical History  Diagnosis Date  . Arthritis   . Seasonal allergies    Past Surgical History  Procedure Laterality Date  . Shoulder surgery Right    No family history on file. History  Substance Use Topics  . Smoking status: Never Smoker   . Smokeless tobacco: Not on file  . Alcohol Use: No   OB History   Grav Para Term Preterm Abortions TAB SAB Ect Mult Living   3 2 2  1  1   2      Review of Systems  Constitutional: Negative for fever.  Respiratory: Negative for chest tightness and shortness of breath.   Gastrointestinal: Negative for abdominal pain.  All other systems reviewed and are negative.     Allergies  Penicillins  Home Medications   Prior to Admission medications   Medication Sig Start Date End Date Taking? Authorizing Provider  acetaminophen (TYLENOL) 325 MG tablet Take 650 mg by mouth every 6 (six) hours as needed for moderate pain.    Historical Provider, MD  Misc Natural Products (COLON CLEANSE PO) Take 2 tablets by mouth 2 (two) times daily. 03/29/13   Historical Provider, MD  Multiple Vitamin (MULTIVITAMIN WITH MINERALS) TABS Take 1 tablet by mouth daily.    Historical Provider, MD  promethazine (PHENERGAN) 25 MG tablet Take 1 tablet (25 mg total) by mouth every 6 (six) hours as needed for nausea or vomiting. 03/31/13   Aviva SignsMarie L Olsen, CNM   BP 134/93  Pulse 76  Temp(Src) 98.2 F (36.8 C) (Oral)  Ht 5\' 11"  (1.803 m)  Wt 206 lb (93.441 kg)  BMI 28.74 kg/m2  SpO2 100%  LMP 07/27/2013 Physical Exam  Nursing  note and vitals reviewed. Constitutional: She appears well-developed and well-nourished. No distress.  HENT:  Head: Normocephalic and atraumatic.  Right Ear: External ear normal.  Left Ear: External ear normal.  Eyes: Conjunctivae are normal. Right eye exhibits no discharge. Left eye exhibits no discharge. No scleral icterus.  Neck: Neck supple. No tracheal deviation present.  Cardiovascular: Normal rate.   Pulmonary/Chest: Effort normal. No stridor. No respiratory distress.  Musculoskeletal: She exhibits no edema.       Left shoulder: She exhibits decreased range of motion, bony tenderness and pain. She exhibits no swelling and no effusion.       Left elbow: She exhibits decreased range of motion. She exhibits no swelling, no effusion, no deformity and no laceration. Tenderness found.       Left wrist: She exhibits decreased range of motion and tenderness. She exhibits no bony tenderness, no swelling, no effusion, no crepitus and no deformity.  Neurological: She is alert. Cranial nerve deficit: no gross deficits.  Skin: Skin is warm and dry. No rash noted.  Psychiatric: She has a normal mood and affect.    ED Course  Procedures (including critical care time) Labs Review Labs Reviewed - No data to display  Imaging Review Dg Elbow  Complete Left  09/03/2013   CLINICAL DATA:  Pain and swelling  EXAM: LEFT ELBOW - COMPLETE 3+ VIEW  COMPARISON:  None.  FINDINGS: Frontal, lateral, and bilateral oblique views were obtained. There is no fracture or dislocation. No joint effusion. Joint spaces appear intact. No erosive change.  IMPRESSION: No abnormality noted.   Electronically Signed   By: Bretta BangWilliam  Woodruff M.D.   On: 09/03/2013 10:38   Dg Wrist Complete Left  09/03/2013   CLINICAL DATA:  Pain and swelling  EXAM: LEFT WRIST - COMPLETE 3+ VIEW  COMPARISON:  None.  FINDINGS: Frontal, oblique, lateral, and ulnar deviation scaphoid images were obtained. There is no fracture or dislocation. Joint  spaces appear intact. No erosive change.  IMPRESSION: No abnormality noted.   Electronically Signed   By: Bretta BangWilliam  Woodruff M.D.   On: 09/03/2013 10:38   Dg Shoulder Left  09/03/2013   CLINICAL DATA:  Pain and swelling  EXAM: LEFT SHOULDER - 2+ VIEW  COMPARISON:  None.  FINDINGS: Frontal, Y scapular, and axillary images were obtained. No fracture or dislocation. Joint spaces appear intact. No erosive change or intra-articular calcification.  IMPRESSION: No abnormality noted.   Electronically Signed   By: Bretta BangWilliam  Woodruff M.D.   On: 09/03/2013 10:39      MDM   Final diagnoses:  Left arm pain    Pt has tenderness in shoulder, elbow and wrist.  No swelling or erythema on my exam to suggest joint effusion.  Doubt infectious etiology.  Doubt DVT.  Normal pulse, doubt acute arterial complication.  Doubt radiculopathy with her having specific areas of tenderness in those joints  ?tendinitis, DC home on nsaids.  Follow up with PCP, consider seeing ortho    Celene KrasJon R Maude Gloor, MD 09/03/13 1121

## 2013-09-03 NOTE — ED Notes (Signed)
Pt c/o pain and swelling to left arm, sts it started about 3 week ago. sts initially it was aching then when to sharp pains and now swelling/throbbing pain. Pt reports she feels like she doesn't have the same strength in it as she used to. Hx of arthritis in right arm and thinks it is moving to her left arm. Denies injury/trauma to arm. Nad, skin warm and dry, resp e/u.

## 2013-11-05 ENCOUNTER — Emergency Department (HOSPITAL_COMMUNITY)
Admission: EM | Admit: 2013-11-05 | Discharge: 2013-11-05 | Disposition: A | Discharge status: Home / Self Care | Payer: Medicaid Other | Attending: Emergency Medicine | Admitting: Emergency Medicine

## 2013-11-05 ENCOUNTER — Emergency Department (HOSPITAL_COMMUNITY): Payer: Medicaid Other

## 2013-11-05 ENCOUNTER — Encounter (HOSPITAL_COMMUNITY): Payer: Self-pay | Admitting: Emergency Medicine

## 2013-11-05 DIAGNOSIS — Z791 Long term (current) use of non-steroidal anti-inflammatories (NSAID): Secondary | ICD-10-CM | POA: Diagnosis not present

## 2013-11-05 DIAGNOSIS — M79604 Pain in right leg: Secondary | ICD-10-CM

## 2013-11-05 DIAGNOSIS — M129 Arthropathy, unspecified: Secondary | ICD-10-CM | POA: Diagnosis not present

## 2013-11-05 DIAGNOSIS — M79609 Pain in unspecified limb: Secondary | ICD-10-CM | POA: Diagnosis not present

## 2013-11-05 DIAGNOSIS — R079 Chest pain, unspecified: Secondary | ICD-10-CM

## 2013-11-05 DIAGNOSIS — Z8709 Personal history of other diseases of the respiratory system: Secondary | ICD-10-CM | POA: Diagnosis not present

## 2013-11-05 DIAGNOSIS — Z880 Allergy status to penicillin: Secondary | ICD-10-CM | POA: Insufficient documentation

## 2013-11-05 DIAGNOSIS — Z79899 Other long term (current) drug therapy: Secondary | ICD-10-CM | POA: Diagnosis not present

## 2013-11-05 DIAGNOSIS — M7989 Other specified soft tissue disorders: Secondary | ICD-10-CM | POA: Diagnosis present

## 2013-11-05 LAB — BASIC METABOLIC PANEL
ANION GAP: 13 (ref 5–15)
BUN: 18 mg/dL (ref 6–23)
CALCIUM: 8.7 mg/dL (ref 8.4–10.5)
CHLORIDE: 103 meq/L (ref 96–112)
CO2: 25 meq/L (ref 19–32)
Creatinine, Ser: 0.97 mg/dL (ref 0.50–1.10)
GFR calc Af Amer: 87 mL/min — ABNORMAL LOW (ref >90)
GFR calc non Af Amer: 75 mL/min — ABNORMAL LOW (ref >90)
GLUCOSE: 80 mg/dL (ref 70–99)
Potassium: 4.3 mEq/L (ref 3.7–5.3)
SODIUM: 141 meq/L (ref 137–147)

## 2013-11-05 LAB — CBC
HCT: 38.4 % (ref 36.0–46.0)
HEMOGLOBIN: 12.2 g/dL (ref 12.0–15.0)
MCH: 28.6 pg (ref 26.0–34.0)
MCHC: 31.8 g/dL (ref 30.0–36.0)
MCV: 89.9 fL (ref 78.0–100.0)
PLATELETS: 297 10*3/uL (ref 150–400)
RBC: 4.27 MIL/uL (ref 3.87–5.11)
RDW: 14.6 % (ref 11.5–15.5)
WBC: 8.2 10*3/uL (ref 4.0–10.5)

## 2013-11-05 LAB — I-STAT TROPONIN, ED
Troponin i, poc: 0 ng/mL (ref 0.00–0.08)
Troponin i, poc: 0 ng/mL (ref 0.00–0.08)

## 2013-11-05 LAB — D-DIMER, QUANTITATIVE (NOT AT ARMC): D DIMER QUANT: 0.87 ug{FEU}/mL — AB (ref 0.00–0.48)

## 2013-11-05 MED ORDER — IOHEXOL 350 MG/ML SOLN
80.0000 mL | Freq: Once | INTRAVENOUS | Status: AC | PRN
  Administered 2013-11-05: 80 mL via INTRAVENOUS

## 2013-11-05 MED ORDER — HYDROCODONE-ACETAMINOPHEN 5-325 MG PO TABS: 1.0000 | ORAL_TABLET | ORAL | Status: DC | PRN

## 2013-11-05 MED ORDER — MORPHINE SULFATE 4 MG/ML IJ SOLN
4.0000 mg | Freq: Once | INTRAMUSCULAR | Status: AC
  Administered 2013-11-05: 4 mg via INTRAVENOUS
  Filled 2013-11-05: qty 1

## 2013-11-05 MED ORDER — SODIUM CHLORIDE 0.9 % IV BOLUS (SEPSIS)
1000.0000 mL | Freq: Once | INTRAVENOUS | Status: AC
  Administered 2013-11-05: 1000 mL via INTRAVENOUS

## 2013-11-05 MED ORDER — HYDROCODONE-ACETAMINOPHEN 5-325 MG PO TABS: 2.0000 | ORAL_TABLET | Freq: Once | ORAL | Status: DC

## 2013-11-05 MED ORDER — KETOROLAC TROMETHAMINE 30 MG/ML IJ SOLN
30.0000 mg | Freq: Once | INTRAMUSCULAR | Status: AC
  Administered 2013-11-05: 30 mg via INTRAVENOUS
  Filled 2013-11-05: qty 1

## 2013-11-05 MED ORDER — IBUPROFEN 200 MG PO TABS: 600.0000 mg | ORAL_TABLET | Freq: Once | ORAL | Status: DC

## 2013-11-05 MED ORDER — IBUPROFEN 800 MG PO TABS: 800.0000 mg | ORAL_TABLET | Freq: Three times a day (TID) | ORAL | Status: DC | PRN

## 2013-11-05 NOTE — Discharge Instructions (Signed)
Chest Pain (Nonspecific) °It is often hard to give a specific diagnosis for the cause of chest pain. There is always a chance that your pain could be related to something serious, such as a heart attack or a blood clot in the lungs. You need to follow up with your health care provider for further evaluation. °CAUSES  °· Heartburn. °· Pneumonia or bronchitis. °· Anxiety or stress. °· Inflammation around your heart (pericarditis) or lung (pleuritis or pleurisy). °· A blood clot in the lung. °· A collapsed lung (pneumothorax). It can develop suddenly on its own (spontaneous pneumothorax) or from trauma to the chest. °· Shingles infection (herpes zoster virus). °The chest wall is composed of bones, muscles, and cartilage. Any of these can be the source of the pain. °· The bones can be bruised by injury. °· The muscles or cartilage can be strained by coughing or overwork. °· The cartilage can be affected by inflammation and become sore (costochondritis). °DIAGNOSIS  °Lab tests or other studies may be needed to find the cause of your pain. Your health care provider may have you take a test called an ambulatory electrocardiogram (ECG). An ECG records your heartbeat patterns over a 24-hour period. You may also have other tests, such as: °· Transthoracic echocardiogram (TTE). During echocardiography, sound waves are used to evaluate how blood flows through your heart. °· Transesophageal echocardiogram (TEE). °· Cardiac monitoring. This allows your health care provider to monitor your heart rate and rhythm in real time. °· Holter monitor. This is a portable device that records your heartbeat and can help diagnose heart arrhythmias. It allows your health care provider to track your heart activity for several days, if needed. °· Stress tests by exercise or by giving medicine that makes the heart beat faster. °TREATMENT  °· Treatment depends on what may be causing your chest pain. Treatment may include: °¨ Acid blockers for  heartburn. °¨ Anti-inflammatory medicine. °¨ Pain medicine for inflammatory conditions. °¨ Antibiotics if an infection is present. °· You may be advised to change lifestyle habits. This includes stopping smoking and avoiding alcohol, caffeine, and chocolate. °· You may be advised to keep your head raised (elevated) when sleeping. This reduces the chance of acid going backward from your stomach into your esophagus. °Most of the time, nonspecific chest pain will improve within 2-3 days with rest and mild pain medicine.  °HOME CARE INSTRUCTIONS  °· If antibiotics were prescribed, take them as directed. Finish them even if you start to feel better. °· For the next few days, avoid physical activities that bring on chest pain. Continue physical activities as directed. °· Do not use any tobacco products, including cigarettes, chewing tobacco, or electronic cigarettes. °· Avoid drinking alcohol. °· Only take medicine as directed by your health care provider. °· Follow your health care provider's suggestions for further testing if your chest pain does not go away. °· Keep any follow-up appointments you made. If you do not go to an appointment, you could develop lasting (chronic) problems with pain. If there is any problem keeping an appointment, call to reschedule. °SEEK MEDICAL CARE IF:  °· Your chest pain does not go away, even after treatment. °· You have a rash with blisters on your chest. °· You have a fever. °SEEK IMMEDIATE MEDICAL CARE IF:  °· You have increased chest pain or pain that spreads to your arm, neck, jaw, back, or abdomen. °· You have shortness of breath. °· You have an increasing cough, or you cough   up blood. °· You have severe back or abdominal pain. °· You feel nauseous or vomit. °· You have severe weakness. °· You faint. °· You have chills. °This is an emergency. Do not wait to see if the pain will go away. Get medical help at once. Call your local emergency services (911 in [place] Do not drive  yourself to the hospital. °MAKE SURE YOU:  °· Understand these instructions. °· Will watch your condition. °· Will get help right away if you are not doing well or get worse. °Document Released: 01/14/2005 Document Revised: 04/11/2013 Document Reviewed: 11/10/2007 °Exit[employer]® Patient Information ©2015 [employer]. This information is not intended to replace advice given to you by your health care provider. Make sure you discuss any questions you have with your health care provider. ° °Arthralgia °Your caregiver has diagnosed you as suffering from an arthralgia. Arthralgia means there is pain in a joint. This can come from many reasons including: °· Bruising the joint which causes soreness (inflammation) in the joint. °· Wear and tear on the joints which occur as we grow older (osteoarthritis). °· Overusing the joint. °· Various forms of arthritis. °· Infections of the joint. °Regardless of the cause of pain in your joint, most of these different pains respond to anti-inflammatory drugs and rest. The exception to this is when a joint is infected, and these cases are treated with antibiotics, if it is a bacterial infection. °HOME CARE INSTRUCTIONS  °· Rest the injured area for as long as directed by your caregiver. Then slowly start using the joint as directed by your caregiver and as the pain allows. Crutches as directed may be useful if the ankles, knees or hips are involved. If the knee was splinted or casted, continue use and care as directed. If an stretchy or elastic wrapping bandage has been applied today, it should be removed and re-applied every 3 to 4 hours. It should not be applied tightly, but firmly enough to keep swelling down. Watch toes and feet for swelling, bluish discoloration, coldness, numbness or excessive pain. If any of these problems (symptoms) occur, remove the ace bandage and re-apply more loosely. If these symptoms persist, contact your caregiver or return to this location. °· For the  first 24 hours, keep the injured extremity elevated on pillows while lying down. °· Apply ice for 15-20 minutes to the sore joint every couple hours while awake for the first half day. Then 03-04 times per day for the first 48 hours. Put the ice in a plastic bag and place a towel between the bag of ice and your skin. °· Wear any splinting, casting, elastic bandage applications, or slings as instructed. °· Only take over-the-counter or prescription medicines for pain, discomfort, or fever as directed by your caregiver. Do not use aspirin immediately after the injury unless instructed by your physician. Aspirin can cause increased bleeding and bruising of the tissues. °· If you were given crutches, continue to use them as instructed and do not resume weight bearing on the sore joint until instructed. °Persistent pain and inability to use the sore joint as directed for more than 2 to 3 days are warning signs indicating that you should see a caregiver for a follow-up visit as soon as possible. Initially, a hairline fracture (break in bone) may not be evident on X-rays. Persistent pain and swelling indicate that further evaluation, non-weight bearing or use of the joint (use of crutches or slings as instructed), or further X-rays are indicated. X-rays may   sometimes not show a small fracture until a week or 10 days later. Make a follow-up appointment with your own caregiver or one to whom we have referred you. A radiologist (specialist in reading X-rays) may read your X-rays. Make sure you know how you are to obtain your X-ray results. Do not assume everything is normal if you do not hear from us. °SEEK MEDICAL CARE IF: °Bruising, swelling, or pain increases. °SEEK IMMEDIATE MEDICAL CARE IF:  °· Your fingers or toes are numb or blue. °· The pain is not responding to medications and continues to stay the same or get worse. °· The pain in your joint becomes severe. °· You develop a fever over 102° F (38.9° C). °· It becomes  impossible to move or use the joint. °MAKE SURE YOU:  °· Understand these instructions. °· Will watch your condition. °· Will get help right away if you are not doing well or get worse. °Document Released: 04/06/2005 Document Revised: 06/29/2011 Document Reviewed: 11/23/2007 °ExitCare® Patient Information ©2015 [employer]. This information is not intended to replace advice given to you by your health care provider. Make sure you discuss any questions you have with your health care provider. ° °

## 2013-11-05 NOTE — ED Notes (Signed)
Pt reports swelling, pain and stiffness to rt leg. Pt reports her leg feels stiff when she walks and heavy walking up and down steps. She wakes up in the middle of the night with leg cramps. Pt states she has been taking excedrine and tylenol for pain. Sx have been going on for 3wks.

## 2013-11-05 NOTE — ED Provider Notes (Signed)
MSE was initiated and I personally evaluated the patient and placed orders (if any) at  8:47 PM on November 05, 2013. Patient presenting with right lower leg swelling and cramping for 2 weeks, no known injury or trauma. She is concerned of a blood clot. She is also reporting shortness of breath on exertion, and waking up at night short of breath. States she has occasional chest pains and palpitations. No history of blood clots. No recent surgeries and she is not on oral contraceptives. Patient will be moved from fast track to the main side of the emergency department. She is well appearing and in no apparent distress, vital signs stable. Heart regular rate and rhythm, lungs clear to auscultation bilaterally. Tenderness to right lower leg, especially over her right calf.  The patient appears stable so that the remainder of the MSE may be completed by another provider.  Trevor MaceRobyn M Albert, PA-C 11/05/13 2049

## 2013-11-05 NOTE — ED Provider Notes (Deleted)
Medical screening examination/treatment/procedure(s) were conducted as a shared visit with non-physician practitioner(s) and myself.  I personally evaluated the patient during the encounter.   EKG Interpretation   Date/Time:  Sunday November 05 2013 20:57:35 EDT Ventricular Rate:  67 PR Interval:  147 QRS Duration: 87 QT Interval:  396 QTC Calculation: 418 R Axis:   49 Text Interpretation:  Sinus rhythm RSR' in V1 or V2, right VCD or RVH  Confirmed by Brodee Mauritz,  DO, Kristl Morioka [phone] on 11/05/2013 9:33:56 PM      Please see my note  Layla MawKristen N Yarelly Kuba, DO 11/05/13 2303

## 2013-11-05 NOTE — ED Notes (Signed)
Pt. reports mild right lower leg swelling / cramping for 2 weeks , denies injury/ambulatory , pedal pulses present , respirations unlabored .

## 2013-11-06 ENCOUNTER — Ambulatory Visit (HOSPITAL_COMMUNITY)
Admission: RE | Admit: 2013-11-06 | Discharge: 2013-11-06 | Disposition: A | Discharge status: Home / Self Care | Payer: Medicaid Other | Source: Ambulatory Visit | Attending: Emergency Medicine | Admitting: Emergency Medicine

## 2013-11-06 DIAGNOSIS — M7989 Other specified soft tissue disorders: Secondary | ICD-10-CM | POA: Insufficient documentation

## 2013-11-06 DIAGNOSIS — M79609 Pain in unspecified limb: Secondary | ICD-10-CM | POA: Diagnosis not present

## 2013-11-06 NOTE — Progress Notes (Signed)
VASCULAR LAB PRELIMINARY  PRELIMINARY  PRELIMINARY  PRELIMINARY  Right lower extremity venous duplex completed.    Preliminary report:  Right:  No evidence of DVT, superficial thrombosis, or Baker's cyst.  Corbett Moulder, RVT 11/06/2013, 3:57 PM

## 2013-11-15 NOTE — ED Provider Notes (Addendum)
CHIEF COMPLAINT: Pain in her right lower extremity for 2 weeks with swelling, chest pain shortness of breath  HPI: Patient is a 33 y.o. F with no significant past medical history who presents emergency room with right lower extremity swelling and pain for 2 weeks. No history of injury but she states her pain is mostly around her knee and then radiates up and down. It is worse with movement. She also states her the past several days she has had chest pain or shortness of breath that is worse with exertion. Chest pain is described as sharp. No nausea, vomiting, diaphoresis or dizziness. No fevers or cough. No history of PE or DVT.  ROS: See HPI Constitutional: no fever  Eyes: no drainage  ENT: no runny nose   Cardiovascular:  no chest pain  Resp: no SOB  GI: no vomiting GU: no dysuria Integumentary: no rash  Allergy: no hives  Musculoskeletal: no leg swelling  Neurological: no slurred speech ROS otherwise negative  PAST MEDICAL HISTORY/PAST SURGICAL HISTORY:  Past Medical History  Diagnosis Date  . Arthritis   . Seasonal allergies     MEDICATIONS:  Prior to Admission medications   Medication Sig Start Date End Date Taking? Authorizing Provider  acetaminophen (TYLENOL) 500 MG tablet Take 1,000 mg by mouth 2 (two) times daily as needed (pain).   Yes Historical Provider, MD  albuterol (PROAIR HFA) 108 (90 BASE) MCG/ACT inhaler Inhale 2 puffs into the lungs 2 (two) times daily as needed for wheezing or shortness of breath.   Yes Historical Provider, MD  aspirin-acetaminophen-caffeine (EXCEDRIN MIGRAINE) [phone] MG per tablet Take 2 tablets by mouth 2 (two) times daily as needed (pain).   Yes Historical Provider, MD  fluticasone (FLONASE) 50 MCG/ACT nasal spray Place 2 sprays into both nostrils daily.   Yes Historical Provider, MD  Multiple Vitamin (MULTIVITAMIN WITH MINERALS) TABS Take 1 tablet by mouth daily.   Yes Historical Provider, MD  Olopatadine HCl (PATADAY) 0.2 % SOLN Place 1  drop into both eyes daily.    Yes Historical Provider, MD  HYDROcodone-acetaminophen (NORCO/VICODIN) 5-325 MG per tablet Take 1 tablet by mouth every 4 (four) hours as needed. 11/05/13   Kristen N Ward, DO  ibuprofen (ADVIL,MOTRIN) 800 MG tablet Take 1 tablet (800 mg total) by mouth every 8 (eight) hours as needed for mild pain. 11/05/13   Kristen N Ward, DO    ALLERGIES:  Allergies  Allergen Reactions  . Penicillins Hives    SOCIAL HISTORY:  History  Substance Use Topics  . Smoking status: Never Smoker   . Smokeless tobacco: Not on file  . Alcohol Use: No    FAMILY HISTORY: No family history on file.  EXAM: BP 125/77  Pulse 63  Temp(Src) 98.3 F (36.8 C) (Oral)  Resp 18  SpO2 100%  LMP 10/31/2013 CONSTITUTIONAL: Alert and oriented and responds appropriately to questions. Well-appearing; well-nourished HEAD: Normocephalic EYES: Conjunctivae clear, PERRL ENT: normal nose; no rhinorrhea; moist mucous membranes; pharynx without lesions noted NECK: Supple, no meningismus, no LAD  CARD: RRR; S1 and S2 appreciated; no murmurs, no clicks, no rubs, no gallops RESP: Normal chest excursion without splinting or tachypnea; breath sounds clear and equal bilaterally; no wheezes, no rhonchi, no rales,  ABD/GI: Normal bowel sounds; non-distended; soft, non-tender, no rebound, no guarding BACK:  The back appears normal and is non-tender to palpation, there is no CVA tenderness EXT: Tender to percussion of the right knee without joint effusion or erythema or warmth or  induration or fluctuance, Normal ROM in all joints; non-tender to palpation; no edema; normal capillary refill; no cyanosis; no calf tenderness or appreciable swelling, equal pulses in all of her extremities SKIN: Normal color for age and race; warm NEURO: Moves all extremities equally PSYCH: The patient's mood and manner are appropriate. Grooming and personal hygiene are appropriate.  MEDICAL DECISION MAKING: Patient here with  complaints of right leg pain in chest and shortness of breath. She has no risk factors for PE or ACS but given her exertional chest pain and history of pain and swelling in her right lower extremity, we'll obtain troponin, d-dimer, chest x-ray. Doubt dissection as she has no tearing pain, radiation of pain into her back or abdomen and has equal pulses in all of her extremities.  ED PROGRESS: Patient's chest x-ray is negative. Her right knee x-ray is also normal. Labs unremarkable. First troponin negative. D-dimer is elevated. We're unable to obtain a venous Doppler at this time to evaluate for DVT of her right lower [place] but will obtain a CT of her chest.    CT of the chest shows no pulmonary embolus. There is or pleural effusions but otherwise her lungs are clear. Her second troponin is negative. Have ordered a venous Doppler of her right lower extremity to be performed her morning. Will give dose of Lovenox prior to discharge. Have discussed return precautions and supportive care instructions. Will get outpatient followup information. Patient verbalizes understanding and is comfortable with plan.     Layla Maw Ward, DO 11/15/13 0244     EKG Interpretation  Date/Time:  Sunday November 05 2013 20:57:35 EDT Ventricular Rate:  67 PR Interval:  147 QRS Duration: 87 QT Interval:  396 QTC Calculation: 418 R Axis:   49 Text Interpretation:  Sinus rhythm RSR' in V1 or V2, right VCD or RVH Confirmed by WARD,  DO, KRISTEN [phone]) on 11/05/2013 9:33:56 PM        Layla Maw Ward, DO 11/15/13 [id number]

## 2013-12-26 ENCOUNTER — Ambulatory Visit: Payer: Self-pay | Admitting: Obstetrics

## 2013-12-28 ENCOUNTER — Ambulatory Visit (INDEPENDENT_AMBULATORY_CARE_PROVIDER_SITE_OTHER): Payer: Medicaid Other | Admitting: Obstetrics

## 2013-12-28 ENCOUNTER — Encounter: Payer: Self-pay | Admitting: Obstetrics

## 2013-12-28 ENCOUNTER — Other Ambulatory Visit: Payer: Self-pay | Admitting: Obstetrics

## 2013-12-28 VITALS — BP 128/78 | HR 78 | Temp 98.2°F | Wt 220.0 lb

## 2013-12-28 DIAGNOSIS — R32 Unspecified urinary incontinence: Secondary | ICD-10-CM

## 2013-12-28 DIAGNOSIS — Z113 Encounter for screening for infections with a predominantly sexual mode of transmission: Secondary | ICD-10-CM

## 2013-12-28 DIAGNOSIS — Z0000 Encounter for general adult medical examination without abnormal findings: Secondary | ICD-10-CM

## 2013-12-28 DIAGNOSIS — F3289 Other specified depressive episodes: Secondary | ICD-10-CM

## 2013-12-28 DIAGNOSIS — F329 Major depressive disorder, single episode, unspecified: Secondary | ICD-10-CM | POA: Insufficient documentation

## 2013-12-28 DIAGNOSIS — Z01419 Encounter for gynecological examination (general) (routine) without abnormal findings: Secondary | ICD-10-CM

## 2013-12-28 LAB — THYROID PANEL WITH TSH
Free Thyroxine Index: 2 (ref 1.4–3.8)
T3 Uptake: 27 % (ref 22.0–35.0)
T4, Total: 7.3 ug/dL (ref 4.5–12.0)
TSH: 1.004 u[IU]/mL (ref 0.350–4.500)

## 2013-12-28 LAB — COMPREHENSIVE METABOLIC PANEL
ALT: 19 U/L (ref 0–35)
AST: 17 U/L (ref 0–37)
Albumin: 4.2 g/dL (ref 3.5–5.2)
Alkaline Phosphatase: 54 U/L (ref 39–117)
BUN: 14 mg/dL (ref 6–23)
CALCIUM: 9.3 mg/dL (ref 8.4–10.5)
CHLORIDE: 100 meq/L (ref 96–112)
CO2: 26 meq/L (ref 19–32)
CREATININE: 1.08 mg/dL (ref 0.50–1.10)
GLUCOSE: 60 mg/dL — AB (ref 70–99)
Potassium: 4.2 mEq/L (ref 3.5–5.3)
Sodium: 136 mEq/L (ref 135–145)
Total Bilirubin: 0.3 mg/dL (ref 0.2–1.2)
Total Protein: 7.5 g/dL (ref 6.0–8.3)

## 2013-12-28 LAB — POCT URINALYSIS DIPSTICK
BILIRUBIN UA: NEGATIVE
Blood, UA: 50
Glucose, UA: NEGATIVE
KETONES UA: NEGATIVE
Nitrite, UA: NEGATIVE
Protein, UA: NEGATIVE
Spec Grav, UA: 1.015
Urobilinogen, UA: NEGATIVE
pH, UA: 6

## 2013-12-28 MED ORDER — CITALOPRAM HYDROBROMIDE 20 MG PO TABS: 20.0000 mg | ORAL_TABLET | Freq: Every day | ORAL | Status: AC

## 2013-12-28 NOTE — Progress Notes (Signed)
Subjective:     Evelyn Olsen is a 33 y.o. female here for a routine exam.  Current complaints: Respiratory problems.  Seen in ER for problems breathing and chest pain.  Crying all the time, fatigue and constipation.  Night sweats..    Personal health questionnaire:  Is patient Ashkenazi Jewish, have a family history of breast and/or ovarian cancer: no Is there a family history of uterine cancer diagnosed at age < 41, gastrointestinal cancer, urinary tract cancer, family member who is a Field seismologist syndrome-associated carrier: no Is the patient overweight and hypertensive, family history of diabetes, personal history of gestational diabetes or PCOS: no Is patient over 15, have PCOS,  family history of premature CHD under age 72, diabetes, smoke, have hypertension or peripheral artery disease:  no At any time, has a partner hit, kicked or otherwise hurt or frightened you?: no Over the past 2 weeks, have you felt down, depressed or hopeless?: no Over the past 2 weeks, have you felt little interest or pleasure in doing things?:no   Gynecologic History Patient's last menstrual period was 12/01/2013. Contraception: none Last Pap: 2014. Results were: normal Last mammogram: n/a. Results were: n/a  Obstetric History OB History  Gravida Para Term Preterm AB SAB TAB Ectopic Multiple Living  _0 # Outcome Date GA Lbr Len/2nd Weight Sex Delivery Anes PTL Lv  3 TRM 03/23/10    F SVD EPI N Y  2 TRM 05/08/08    M SVD   Y  1 SAB 2009              Past Medical History  Diagnosis Date  . Arthritis   . Seasonal allergies     Past Surgical History  Procedure Laterality Date  . Shoulder surgery Right     Current outpatient prescriptions:acetaminophen (TYLENOL) 500 MG tablet, Take 1,000 mg by mouth 2 (two) times daily as needed (pain)., Disp: , Rfl: ;  albuterol (PROAIR HFA) 108 (90 BASE) MCG/ACT inhaler, Inhale 2 puffs into the lungs 2 (two) times daily as needed for wheezing or  shortness of breath., Disp: , Rfl:  aspirin-acetaminophen-caffeine (EXCEDRIN MIGRAINE) 250-250-65 MG per tablet, Take 2 tablets by mouth 2 (two) times daily as needed (pain)., Disp: , Rfl: ;  fluticasone (FLONASE) 50 MCG/ACT nasal spray, Place 2 sprays into both nostrils daily., Disp: , Rfl: ;  HYDROcodone-acetaminophen (NORCO/VICODIN) 5-325 MG per tablet, Take 1 tablet by mouth every 4 (four) hours as needed., Disp: 15 tablet, Rfl: 0 ibuprofen (ADVIL,MOTRIN) 800 MG tablet, Take 1 tablet (800 mg total) by mouth every 8 (eight) hours as needed for mild pain., Disp: 30 tablet, Rfl: 0;  Multiple Vitamin (MULTIVITAMIN WITH MINERALS) TABS, Take 1 tablet by mouth daily., Disp: , Rfl: ;  Olopatadine HCl (PATADAY) 0.2 % SOLN, Place 1 drop into both eyes daily. , Disp: , Rfl: ;  citalopram (CELEXA) 20 MG tablet, Take 1 tablet (20 mg total) by mouth daily., Disp: 30 tablet, Rfl: 11 Allergies  Allergen Reactions  . Penicillins Hives    History  Substance Use Topics  . Smoking status: Never Smoker   . Smokeless tobacco: Not on file  . Alcohol Use: No    Family History  Problem Relation Age of Onset  . Autism Son       Review of Systems  Constitutional: negative for weight loss.  Positive for fatigue. Respiratory: positive for cough and wheezing Cardiovascular: positive for chest  pain, fatigue and palpitations Gastrointestinal: negative for abdominal pain and change in bowel habits Musculoskeletal:negative for myalgias Neurological: negative for gait problems and tremors Behavioral/Psych: negative for abusive relationship.  Positive for depression Endocrine: negative for temperature intolerance   Genitourinary:negative for abnormal menstrual periods, genital lesions, hot flashes, sexual problems and vaginal discharge Integument/breast: negative for breast lump, breast tenderness, nipple discharge and skin lesion(s)    Objective:       BP 128/78  Pulse 78  Temp(Src) 98.2 F (36.8 C)  Wt 220  lb (99.791 kg)  LMP 12/01/2013 General:   alert  Skin:   no rash or abnormalities  Lungs:   clear to auscultation bilaterally  Heart:   regular rate and rhythm, S1, S2 normal, no murmur, click, rub or gallop  Breasts:   normal without suspicious masses, skin or nipple changes or axillary nodes  Abdomen:  normal findings: no organomegaly, soft, non-tender and no hernia  Pelvis:  External genitalia: normal general appearance Urinary system: urethral meatus normal and bladder without fullness, nontender Vaginal: normal without tenderness, induration or masses Cervix: normal appearance Adnexa: normal bimanual exam Uterus: anteverted and non-tender, normal size   Lab Review Urine pregnancy test Labs reviewed yes Radiologic studies reviewed no    Assessment:    Healthy female exam.   Depression   Plan:    Education reviewed: calcium supplements, depression evaluation, low fat, low cholesterol diet, safe sex/STD prevention, self breast exams and weight bearing exercise. Contraception: none. Follow up in: 1 year. Referred to IM for routine health maintenance.   Meds ordered this encounter  Medications  . citalopram (CELEXA) 20 MG tablet    Sig: Take 1 tablet (20 mg total) by mouth daily.    Dispense:  30 tablet    Refill:  11   Orders Placed This Encounter  Procedures  . HIV antibody  . Hepatitis B surface antigen  . RPR  . Hepatitis C antibody  . Thyroid Panel With TSH  . Comp Met (CMET)  . Ambulatory referral to Internal Medicine    Referral Priority:  Routine    Referral Type:  Consultation    Referral Reason:  Specialty Services Required    Requested Specialty:  Internal Medicine    Number of Visits Requested:  1  . POCT urinalysis dipstick

## 2013-12-28 NOTE — Addendum Note (Signed)
Addended by: Marya Landry D on: 12/28/2013 11:55 AM   Modules accepted: Orders

## 2013-12-29 LAB — WET PREP BY MOLECULAR PROBE
CANDIDA SPECIES: NEGATIVE
GARDNERELLA VAGINALIS: NEGATIVE
Trichomonas vaginosis: NEGATIVE

## 2013-12-29 LAB — RPR

## 2013-12-29 LAB — HEPATITIS B SURFACE ANTIGEN: Hepatitis B Surface Ag: NEGATIVE

## 2013-12-29 LAB — HIV ANTIBODY (ROUTINE TESTING W REFLEX): HIV: NONREACTIVE

## 2013-12-29 LAB — GC/CHLAMYDIA PROBE AMP
CT PROBE, AMP APTIMA: NEGATIVE
GC Probe RNA: NEGATIVE

## 2013-12-29 LAB — HEPATITIS C ANTIBODY: HCV AB: NEGATIVE

## 2014-01-01 LAB — PAP IG AND HPV HIGH-RISK: HPV DNA High Risk: NOT DETECTED

## 2014-02-19 ENCOUNTER — Encounter: Payer: Self-pay | Admitting: Obstetrics

## 2014-03-23 ENCOUNTER — Telehealth: Payer: Self-pay | Admitting: *Deleted

## 2014-03-23 DIAGNOSIS — Z30019 Encounter for initial prescription of contraceptives, unspecified: Secondary | ICD-10-CM

## 2014-03-23 MED ORDER — ETONOGESTREL-ETHINYL ESTRADIOL 0.12-0.015 MG/24HR VA RING: VAGINAL_RING | VAGINAL | Status: AC

## 2014-03-23 NOTE — Telephone Encounter (Signed)
Okay to fill per Dr. Clearance CootsHarper.

## 2014-03-23 NOTE — Telephone Encounter (Signed)
Patient is requesting a prescription for birth control. Patient is scheduled for a birth control consult to discuss the Mirena or Nexplanon, but would like something for birth control in the mean time. Patient is requesting a prescription for Nuvaring to be sent in.

## 2014-04-16 ENCOUNTER — Encounter: Payer: Self-pay | Admitting: *Deleted

## 2014-04-17 ENCOUNTER — Institutional Professional Consult (permissible substitution): Payer: Self-pay | Admitting: Obstetrics

## 2014-06-04 ENCOUNTER — Ambulatory Visit: Payer: Medicaid Other | Admitting: Certified Nurse Midwife

## 2014-06-14 ENCOUNTER — Ambulatory Visit (INDEPENDENT_AMBULATORY_CARE_PROVIDER_SITE_OTHER): Payer: Medicaid Other | Admitting: Certified Nurse Midwife

## 2014-06-14 ENCOUNTER — Encounter: Payer: Self-pay | Admitting: Certified Nurse Midwife

## 2014-06-14 VITALS — BP 132/83 | HR 69 | Temp 98.3°F | Wt 218.0 lb

## 2014-06-14 DIAGNOSIS — N63 Unspecified lump in breast: Secondary | ICD-10-CM

## 2014-06-14 DIAGNOSIS — N6321 Unspecified lump in the left breast, upper outer quadrant: Secondary | ICD-10-CM

## 2014-06-14 NOTE — Progress Notes (Signed)
Patient ID: Evelyn Olsen, female   DOB: 01/27/1981, 34 y.o.   MRN: [id number]   Chief Complaint  Patient presents with  . Breast Problem    left side lump, sore/tenderness    HPI Evelyn Olsen is a 34 y.o. female.  Discovered a breast lump in the left breast about a month ago.  Last LMP was January 8th, 2016.  Lump is tender to the touch.  No abnormalities with the right side, denies any nipple discharge.  Denies any GYN/Breast cancers in the family.  Currently using NuvaRing for contraception.   Not breastfeeding.  Denies tobacco use.  Denies any recent infection.   HPI  Past Medical History  Diagnosis Date  . Arthritis   . Seasonal allergies     Past Surgical History  Procedure Laterality Date  . Shoulder surgery Right     Family History  Problem Relation Age of Onset  . Autism Son     Social History History  Substance Use Topics  . Smoking status: Never Smoker   . Smokeless tobacco: Not on file  . Alcohol Use: No    Allergies  Allergen Reactions  . Penicillins Hives    Current Outpatient Prescriptions  Medication Sig Dispense Refill  . acetaminophen (TYLENOL) 500 MG tablet Take 1,000 mg by mouth 2 (two) times daily as needed (pain).    Marland Kitchen. albuterol (PROAIR HFA) 108 (90 BASE) MCG/ACT inhaler Inhale 2 puffs into the lungs 2 (two) times daily as needed for wheezing or shortness of breath.    Marland Kitchen. aspirin-acetaminophen-caffeine (EXCEDRIN MIGRAINE) 250-250-65 MG per tablet Take 2 tablets by mouth 2 (two) times daily as needed (pain).    . citalopram (CELEXA) 20 MG tablet Take 1 tablet (20 mg total) by mouth daily. 30 tablet 11  . fluticasone (FLONASE) 50 MCG/ACT nasal spray Place 2 sprays into both nostrils daily.    Marland Kitchen. HYDROcodone-acetaminophen (NORCO/VICODIN) 5-325 MG per tablet Take 1 tablet by mouth every 4 (four) hours as needed. 15 tablet 0  . Multiple Vitamin (MULTIVITAMIN WITH MINERALS) TABS Take 1 tablet by mouth daily.    . Olopatadine HCl (PATADAY) 0.2 %  SOLN Place 1 drop into both eyes daily.     Marland Kitchen. etonogestrel-ethinyl estradiol (NUVARING) 0.12-0.015 MG/24HR vaginal ring Insert vaginally and leave in place for 3 consecutive weeks, then remove for 1 week. (Patient not taking: Reported on 06/14/2014) 1 each 12  . ibuprofen (ADVIL,MOTRIN) 800 MG tablet Take 1 tablet (800 mg total) by mouth every 8 (eight) hours as needed for mild pain. 30 tablet 0   No current facility-administered medications for this visit.    Review of Systems Review of Systems Constitutional: negative for fatigue and weight loss Respiratory: negative for cough and wheezing Cardiovascular: negative for chest pain, fatigue and palpitations Gastrointestinal: negative for abdominal pain and change in bowel habits Genitourinary:negative Integument/breast: negative for nipple discharge Musculoskeletal:negative for myalgias Neurological: negative for gait problems and tremors Behavioral/Psych: negative for abusive relationship, depression Endocrine: negative for temperature intolerance     Blood pressure 132/83, pulse 69, temperature 98.3 F (36.8 C), weight 98.884 kg (218 lb), last menstrual period 04/27/2014.  Physical Exam Physical Exam General:   alert  Skin:   no rash or abnormalities  Lungs:   clear to auscultation bilaterally  Heart:   regular rate and rhythm, S1, S2 normal, no murmur, click, rub or gallop  Breasts:   normal right without suspicious masses, skin or nipple changes or axillary nodes Left breast  has a mass around the 2'oclock position towards the axillary region, skin dimpling around the area noted.  No nipple discharge noted.  Tender to palpation.  Mobile, about 1cm in diameter.  No erythema or edema.     75% of 15 min visit spent on counseling and coordination of care.   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 MM Digital Diagnostic Bilat    *COSIGN REQ 06-14-14 LEFT BREAST LUMP @ 2  PF;09/07/2001 DEF LOC          NO NEEDS/NO IMPLANTS FD/BARB [phone]       MEDICAID     Standing Status: Future     Number of Occurrences:      Standing Expiration Date: 08/13/2015    Order Specific Question:  Reason for Exam (SYMPTOM  OR DIAGNOSIS REQUIRED)    Answer:  LEFT BREAST LUMP @ 2     Order Specific Question:  Is the patient pregnant?    Answer:  No    Order Specific Question:  Preferred imaging location?    Answer:  [facility]  . US BREAST LTD UNI LEFT INC AXILLA    *COSIGN REQ 06-14-14 LEFT BREAST LUMP @ 2  PF;09/07/2001 DEF LOC NO NEEDS/NO IMPLANTS FD/BARB [phone] MEDICAID      Standing Status: Future     Number of Occurrences:      Standing Expiration Date: 08/13/2015    Order Specific Question:  Reason for Exam (SYMPTOM  OR DIAGNOSIS REQUIRED)    Answer:  LEFT BREAST LUMP @ 2     Order Specific Question:  Preferred imaging location?    Answer:  [facility]   No orders of the defined types were placed in this encounter.      Follow up as needed.

## 2014-06-21 ENCOUNTER — Ambulatory Visit
Admission: RE | Admit: 2014-06-21 | Discharge: 2014-06-21 | Disposition: A | Discharge status: Home / Self Care | Payer: Medicaid Other | Source: Ambulatory Visit | Attending: Certified Nurse Midwife | Admitting: Certified Nurse Midwife

## 2014-06-21 ENCOUNTER — Other Ambulatory Visit: Payer: Self-pay | Admitting: Certified Nurse Midwife

## 2014-06-21 DIAGNOSIS — N6321 Unspecified lump in the left breast, upper outer quadrant: Secondary | ICD-10-CM

## 2014-06-21 DIAGNOSIS — N6320 Unspecified lump in the left breast, unspecified quadrant: Secondary | ICD-10-CM

## 2014-06-25 ENCOUNTER — Ambulatory Visit: Payer: Medicaid Other | Admitting: Obstetrics

## 2015-01-01 ENCOUNTER — Ambulatory Visit: Payer: Medicaid Other | Admitting: Obstetrics

## 2015-05-07 ENCOUNTER — Emergency Department (HOSPITAL_COMMUNITY)
Admission: EM | Admit: 2015-05-07 | Discharge: 2015-05-07 | Disposition: A | Discharge status: Home / Self Care | Payer: Medicaid Other | Attending: Emergency Medicine | Admitting: Emergency Medicine

## 2015-05-07 ENCOUNTER — Encounter (HOSPITAL_COMMUNITY): Payer: Self-pay | Admitting: *Deleted

## 2015-05-07 DIAGNOSIS — M25571 Pain in right ankle and joints of right foot: Secondary | ICD-10-CM | POA: Diagnosis not present

## 2015-05-07 DIAGNOSIS — Z79899 Other long term (current) drug therapy: Secondary | ICD-10-CM | POA: Diagnosis not present

## 2015-05-07 DIAGNOSIS — R079 Chest pain, unspecified: Secondary | ICD-10-CM | POA: Diagnosis present

## 2015-05-07 DIAGNOSIS — M25471 Effusion, right ankle: Secondary | ICD-10-CM | POA: Insufficient documentation

## 2015-05-07 DIAGNOSIS — Z880 Allergy status to penicillin: Secondary | ICD-10-CM | POA: Insufficient documentation

## 2015-05-07 DIAGNOSIS — Z7951 Long term (current) use of inhaled steroids: Secondary | ICD-10-CM | POA: Diagnosis not present

## 2015-05-07 DIAGNOSIS — R05 Cough: Secondary | ICD-10-CM | POA: Insufficient documentation

## 2015-05-07 DIAGNOSIS — M25561 Pain in right knee: Secondary | ICD-10-CM | POA: Insufficient documentation

## 2015-05-07 DIAGNOSIS — M79604 Pain in right leg: Secondary | ICD-10-CM

## 2015-05-07 DIAGNOSIS — R0789 Other chest pain: Secondary | ICD-10-CM | POA: Insufficient documentation

## 2015-05-07 DIAGNOSIS — R059 Cough, unspecified: Secondary | ICD-10-CM

## 2015-05-07 LAB — I-STAT TROPONIN, ED: Troponin i, poc: 0 ng/mL (ref 0.00–0.08)

## 2015-05-07 LAB — PROTIME-INR
INR: 1.16 (ref 0.00–1.49)
Prothrombin Time: 15 seconds (ref 11.6–15.2)

## 2015-05-07 LAB — CBC
HEMATOCRIT: 37.2 % (ref 36.0–46.0)
HEMOGLOBIN: 12 g/dL (ref 12.0–15.0)
MCH: 28.1 pg (ref 26.0–34.0)
MCHC: 32.3 g/dL (ref 30.0–36.0)
MCV: 87.1 fL (ref 78.0–100.0)
Platelets: 296 10*3/uL (ref 150–400)
RBC: 4.27 MIL/uL (ref 3.87–5.11)
RDW: 14.5 % (ref 11.5–15.5)
WBC: 8.2 10*3/uL (ref 4.0–10.5)

## 2015-05-07 LAB — BASIC METABOLIC PANEL
ANION GAP: 10 (ref 5–15)
BUN: 14 mg/dL (ref 6–20)
CHLORIDE: 104 mmol/L (ref 101–111)
CO2: 26 mmol/L (ref 22–32)
CREATININE: 1.03 mg/dL — AB (ref 0.44–1.00)
Calcium: 9.1 mg/dL (ref 8.9–10.3)
GFR calc non Af Amer: >60 mL/min (ref >60)
Glucose, Bld: 81 mg/dL (ref 65–99)
Potassium: 3.7 mmol/L (ref 3.5–5.1)
SODIUM: 140 mmol/L (ref 135–145)

## 2015-05-07 MED ORDER — IBUPROFEN 800 MG PO TABS: 800.0000 mg | ORAL_TABLET | Freq: Three times a day (TID) | ORAL | Status: AC | PRN

## 2015-05-07 NOTE — Discharge Instructions (Signed)
Chest Wall Pain Chest wall pain is pain in or around the bones and muscles of your chest. Sometimes, an injury causes this pain. Sometimes, the cause may not be known. This pain may take several weeks or longer to get better. HOME CARE INSTRUCTIONS  Pay attention to any changes in your symptoms. Take these actions to help with your pain:   Rest as told by your health care provider.   Avoid activities that cause pain. These include any activities that use your chest muscles or your abdominal and side muscles to lift heavy items.   If directed, apply ice to the painful area:  Put ice in a plastic bag.  Place a towel between your skin and the bag.  Leave the ice on for 20 minutes, 2-3 times per day.  Take over-the-counter and prescription medicines only as told by your health care provider.  Do not use tobacco products, including cigarettes, chewing tobacco, and e-cigarettes. If you need help quitting, ask your health care provider.  Keep all follow-up visits as told by your health care provider. This is important. SEEK MEDICAL CARE IF:  You have a fever.  Your chest pain becomes worse.  You have new symptoms. SEEK IMMEDIATE MEDICAL CARE IF:  You have nausea or vomiting.  You feel sweaty or light-headed.  You have a cough with phlegm (sputum) or you cough up blood.  You develop shortness of breath.   This information is not intended to replace advice given to you by your health care provider. Make sure you discuss any questions you have with your health care provider.   Document Released: 04/06/2005 Document Revised: 12/26/2014 Document Reviewed: 07/02/2014 Elsevier Interactive Patient Education 2016 Else[employer] Inc. Ankle Pain Ankle pain is a common symptom. The bones, cartilage, tendons, and muscles of the ankle joint perform a lot of work each day. The ankle joint holds your body weight and allows you to move around. Ankle pain can occur on either side or back of 1 or  both ankles. Ankle pain may be sharp and burning or dull and aching. There may be tenderness, stiffness, redness, or warmth around the ankle. The pain occurs more often when a person walks or puts pressure on the ankle. CAUSES  There are many reasons ankle pain can develop. It is important to work with your caregiver to identify the cause since many conditions can impact the bones, cartilage, muscles, and tendons. Causes for ankle pain include:  Injury, including a break (fracture), sprain, or strain often due to a fall, sports, or a high-impact activity.  Swelling (inflammation) of a tendon (tendonitis).  Achilles tendon rupture.  Ankle instability after repeated sprains and strains.  Poor foot alignment.  Pressure on a nerve (tarsal tunnel syndrome).  Arthritis in the ankle or the lining of the ankle.  Crystal formation in the ankle (gout or pseudogout). DIAGNOSIS  A diagnosis is based on your medical history, your symptoms, results of your physical exam, and results of diagnostic tests. Diagnostic tests may include X-ray exams or a computerized magnetic scan (magnetic resonance imaging, MRI). TREATMENT  Treatment will depend on the cause of your ankle pain and may include:  Keeping pressure off the ankle and limiting activities.  Using crutches or other walking support (a cane or brace).  Using rest, ice, compression, and elevation.  Participating in physical therapy or home exercises.  Wearing shoe inserts or special shoes.  Losing weight.  Taking medications to reduce pain or swelling or receiving an injection.  Undergoing surgery. HOME CARE INSTRUCTIONS   Only take over-the-counter or prescription medicines for pain, discomfort, or fever as directed by your caregiver.  Put ice on the injured area.  Put ice in a plastic bag.  Place a towel between your skin and the bag.  Leave the ice on for 15-20 minutes at a time, 03-04 times a day.  Keep your leg raised  (elevated) when possible to lessen swelling.  Avoid activities that cause ankle pain.  Follow specific exercises as directed by your caregiver.  Record how often you have ankle pain, the location of the pain, and what it feels like. This information may be helpful to you and your caregiver.  Ask your caregiver about returning to work or sports and whether you should drive.  Follow up with your caregiver for further examination, therapy, or testing as directed. SEEK MEDICAL CARE IF:   Pain or swelling continues or worsens beyond 1 week.  You have an oral temperature above 102 F (38.9 C).  You are feeling unwell or have chills.  You are having an increasingly difficult time with walking.  You have loss of sensation or other new symptoms.  You have questions or concerns. MAKE SURE YOU:   Understand these instructions.  Will watch your condition.  Will get help right away if you are not doing well or get worse.   This information is not intended to replace advice given to you by your health care provider. Make sure you discuss any questions you have with your health care provider.   Document Released: 09/24/2009 Document Revised: 06/29/2011 Document Reviewed: 11/06/2014 Elsevier Interactive Patient Education 2016 Elsevier Inc. Knee Pain Knee pain is a very common symptom and can have many causes. Knee pain often goes away when you follow your health care provider's instructions for relieving pain and discomfort at home. However, knee pain can develop into a condition that needs treatment. Some conditions may include:  Arthritis caused by wear and tear (osteoarthritis).  Arthritis caused by swelling and irritation (rheumatoid arthritis or gout).  A cyst or growth in your knee.  An infection in your knee joint.  An injury that will not heal.  Damage, swelling, or irritation of the tissues that support your knee (torn ligaments or tendinitis). If your knee pain  continues, additional tests may be ordered to diagnose your condition. Tests may include X-rays or other imaging studies of your knee. You may also need to have fluid removed from your knee. Treatment for ongoing knee pain depends on the cause, but treatment may include:  Medicines to relieve pain or swelling.  Steroid injections in your knee.  Physical therapy.  Surgery. HOME CARE INSTRUCTIONS  Take medicines only as directed by your health care provider.  Rest your knee and keep it raised (elevated) while you are resting.  Do not do things that cause or worsen pain.  Avoid high-impact activities or exercises, such as running, jumping rope, or doing jumping jacks.  Apply ice to the knee area:  Put ice in a plastic bag.  Place a towel between your skin and the bag.  Leave the ice on for 20 minutes, 2-3 times a day.  Ask your health care provider if you should wear an elastic knee support.  Keep a pillow under your knee when you sleep.  Lose weight if you are overweight. Extra weight can put pressure on your knee.  Do not use any tobacco products, including cigarettes, chewing tobacco, or electronic cigarettes. If  you need help quitting, ask your health care provider. Smoking may slow the healing of any bone and joint problems that you may have. SEEK MEDICAL CARE IF:  Your knee pain continues, changes, or gets worse.  You have a fever along with knee pain.  Your knee buckles or locks up.  Your knee becomes more swollen. SEEK IMMEDIATE MEDICAL CARE IF:   Your knee joint feels hot to the touch.  You have chest pain or trouble breathing.   This information is not intended to replace advice given to you by your health care provider. Make sure you discuss any questions you have with your health care provider.   Document Released: 02/01/2007 Document Revised: 04/27/2014 Document Reviewed: 11/20/2013 Elsevier Interactive Patient Education [employer] Elsevier Inc[employer]

## 2015-05-07 NOTE — ED Provider Notes (Signed)
CSN: [id number]     Arrival date & time 05/07/15  1043 History   First MD Initiated Contact with Patient 05/07/15 1044     Chief Complaint  Patient presents with  . Chest Pain  . Leg Pain     (Consider location/radiation/quality/duration/timing/severity/associated sxs/prior Treatment) Patient is a 35 y.o. female presenting with chest pain and leg pain. The history is provided by the patient.  Chest Pain Pain location:  L chest and R chest Pain quality: aching   Pain radiates to:  Does not radiate Pain radiates to the back: no   Pain severity:  Moderate Onset quality:  Gradual Duration:  3 days Timing:  Intermittent Progression:  Waxing and waning Chronicity:  New Context: breathing and at rest   Context: no movement and not raising an arm   Relieved by:  Nothing Worsened by:  Nothing tried Ineffective treatments:  None tried Associated symptoms: cough (4 days)   Associated symptoms: no abdominal pain, no fever and no shortness of breath   Risk factors: no diabetes mellitus, no high cholesterol, no hypertension, no prior DVT/PE and no smoking   Leg Pain Location:  Ankle and knee Time since incident:  3 weeks Injury: no   Knee location:  R knee Ankle location:  R ankle Pain details:    Quality:  Aching   Radiates to:  Does not radiate   Severity:  Moderate   Onset quality:  Gradual   Timing:  Constant   Progression:  Unchanged Chronicity:  New Prior injury to area:  No Relieved by:  Nothing Ineffective treatments: excedrin. Associated symptoms: no decreased ROM, no fever and no muscle weakness     Past Medical History  Diagnosis Date  . Arthritis   . Seasonal allergies    Past Surgical History  Procedure Laterality Date  . Shoulder surgery Right    Family History  Problem Relation Age of Onset  . Autism Son    Social History  Substance Use Topics  . Smoking status: Never Smoker   . Smokeless tobacco: None  . Alcohol Use: No   OB History    Gravida  Para Term Preterm AB TAB SAB Ectopic Multiple Living   Review of Systems  Constitutional: Negative for fever.  Respiratory: Positive for cough (4 days). Negative for shortness of breath.   Cardiovascular: Positive for chest pain.  Gastrointestinal: Negative for abdominal pain.  All other systems reviewed and are negative.     Allergies  Penicillins  Home Medications   Prior to Admission medications   Medication Sig Start Date End Date Taking? Authorizing Provider  albuterol (PROAIR HFA) 108 (90 BASE) MCG/ACT inhaler Inhale 2 puffs into the lungs 2 (two) times daily as needed for wheezing or shortness of breath.   Yes Historical Provider, MD  aspirin-acetaminophen-caffeine (EXCEDRIN MIGRAINE) [phone] MG per tablet Take 2 tablets by mouth 2 (two) times daily as needed (pain).   Yes Historical Provider, MD  citalopram (CELEXA) 20 MG tablet Take 1 tablet (20 mg total) by mouth daily. 12/28/13  Yes Brock Bad, MD  fluticasone (FLONASE) 50 MCG/ACT nasal spray Place 2 sprays into both nostrils daily.   Yes Historical Provider, MD  Multiple Vitamin (MULTIVITAMIN WITH MINERALS) TABS Take 1 tablet by mouth daily.   Yes Historical Provider, MD  Olopatadine HCl (PATADAY) 0.2 % SOLN Place 1 drop into both eyes daily.    Yes Historical  Provider, MD  etonogestrel-ethinyl estradiol (NUVARING) 0.12-0.015 MG/24HR vaginal ring Insert vaginally and leave in place for 3 consecutive weeks, then remove for 1 week. Patient not taking: Reported on 06/14/2014 03/23/14   Brock Bad, MD  HYDROcodone-acetaminophen (NORCO/VICODIN) 5-325 MG per tablet Take 1 tablet by mouth every 4 (four) hours as needed. Patient not taking: Reported on 05/07/2015 11/05/13   Kristen N Ward, DO  ibuprofen (ADVIL,MOTRIN) 800 MG tablet Take 1 tablet (800 mg total) by mouth every 8 (eight) hours as needed for mild pain. Patient not taking: Reported on 05/07/2015 11/05/13   Kristen N Ward, DO   BP 139/97  mmHg  Pulse 66  Temp(Src) 98.1 F (36.7 C) (Oral)  Resp 16  Ht  (1.803 m)  Wt 230 lb (104.327 kg)  BMI 32.09 kg/m2  SpO2 98%  LMP 04/25/2015 Physical Exam  Constitutional: She is oriented to person, place, and time. She appears well-developed and well-nourished. No distress.  HENT:  Head: Normocephalic.  Eyes: Conjunctivae are normal.  Neck: Neck supple. No tracheal deviation present.  Cardiovascular: Normal rate, regular rhythm and normal heart sounds.   Pulmonary/Chest: Effort normal and breath sounds normal. No respiratory distress. She exhibits tenderness (diffuse anterior).  Abdominal: Soft. She exhibits no distension.  Musculoskeletal:  Mild right ankle swelling with medial tenderness. No erythema, surrounding soft tissue soft and not indurated.  Neurological: She is alert and oriented to person, place, and time.  Skin: Skin is warm and dry.  Psychiatric: She has a normal mood and affect.  Vitals reviewed.   ED Course  Procedures (including critical care time) Labs Review Labs Reviewed  BASIC METABOLIC PANEL - Abnormal; Notable for the following:    Creatinine, Ser 1.03 (*)    All other components within normal limits  CBC  PROTIME-INR  I-STAT TROPOININ, ED    Imaging Review No results found. I have personally reviewed and evaluated these images and lab results as part of my medical decision-making.   EKG Interpretation   Date/Time:  Tuesday May 07 2015 10:49:04 EST Ventricular Rate:  62 PR Interval:  154 QRS Duration: 87 QT Interval:  393 QTC Calculation: 399 R Axis:   53 Text Interpretation:  Sinus rhythm Low voltage, precordial leads RSR' in  V1 or V2, right VCD or RVH No significant change since last tracing  Confirmed by Wakisha Alberts MD, Maguadalupe Lata ([id number]) on 05/07/2015 11:32:32 AM      MDM   Final diagnoses:  Cough  Chest wall pain  Right leg pain    35 y.o. female presents with noncardiac chest pain after having a cough for the last 4  days. AFVSS here, no clinical evidence of pneumonia. Mild swelling of the right lower extremity distally with pain in knee and ankle, no clinical evidence of DVT currently. Patient has orthopedics follow-up already scheduled for tomorrow. PERC negative, doubt PE. Pt given instructions for supportive care including NSAIDs, rest, ice, compression, and elevation to help alleviate symptoms. Plan to follow up with PCP as needed and return precautions discussed for worsening or new concerning symptoms.     Lyndal Pulley, MD 05/07/15 [phone]

## 2015-05-07 NOTE — ED Notes (Signed)
Pt sent here from [facility] with c/o CP and right lower extremity pain and swelling.  Pt being evaluated for leg pain when she started having CP.  [facility] called and pt transported to [facility] ED.

## 2015-05-08 ENCOUNTER — Ambulatory Visit (HOSPITAL_COMMUNITY): Payer: Medicaid Other

## 2015-05-08 ENCOUNTER — Other Ambulatory Visit (HOSPITAL_COMMUNITY): Payer: Self-pay | Admitting: Orthopaedic Surgery

## 2015-05-08 DIAGNOSIS — M7989 Other specified soft tissue disorders: Principal | ICD-10-CM

## 2015-05-08 DIAGNOSIS — M79661 Pain in right lower leg: Secondary | ICD-10-CM

## 2015-05-10 ENCOUNTER — Ambulatory Visit (HOSPITAL_COMMUNITY)
Admission: RE | Admit: 2015-05-10 | Discharge: 2015-05-10 | Disposition: A | Discharge status: Home / Self Care | Payer: Medicaid Other | Source: Ambulatory Visit | Attending: Orthopaedic Surgery | Admitting: Orthopaedic Surgery

## 2015-05-10 DIAGNOSIS — M79604 Pain in right leg: Secondary | ICD-10-CM | POA: Diagnosis not present

## 2015-05-10 DIAGNOSIS — M7989 Other specified soft tissue disorders: Secondary | ICD-10-CM

## 2015-05-10 DIAGNOSIS — M79661 Pain in right lower leg: Secondary | ICD-10-CM

## 2015-05-10 NOTE — Progress Notes (Signed)
VASCULAR LAB PRELIMINARY  PRELIMINARY  PRELIMINARY  PRELIMINARY  Right lower extremity venous duplex completed.    Preliminary report:  Right:  No evidence of DVT, superficial thrombosis, or Baker's cyst.  Ziad Maye, RVS 05/10/2015, 10:03 AM

## 2015-05-13 ENCOUNTER — Encounter (HOSPITAL_COMMUNITY): Payer: Medicaid Other

## 2015-08-17 ENCOUNTER — Encounter (HOSPITAL_COMMUNITY): Payer: Self-pay

## 2015-08-17 ENCOUNTER — Emergency Department (HOSPITAL_COMMUNITY)
Admission: EM | Admit: 2015-08-17 | Discharge: 2015-08-17 | Disposition: A | Discharge status: Home / Self Care | Payer: Medicaid Other | Attending: Emergency Medicine | Admitting: Emergency Medicine

## 2015-08-17 DIAGNOSIS — L720 Epidermal cyst: Secondary | ICD-10-CM

## 2015-08-17 DIAGNOSIS — Z23 Encounter for immunization: Secondary | ICD-10-CM | POA: Insufficient documentation

## 2015-08-17 DIAGNOSIS — L02412 Cutaneous abscess of left axilla: Secondary | ICD-10-CM | POA: Diagnosis not present

## 2015-08-17 DIAGNOSIS — L089 Local infection of the skin and subcutaneous tissue, unspecified: Secondary | ICD-10-CM

## 2015-08-17 DIAGNOSIS — Z79899 Other long term (current) drug therapy: Secondary | ICD-10-CM | POA: Diagnosis not present

## 2015-08-17 DIAGNOSIS — Z7951 Long term (current) use of inhaled steroids: Secondary | ICD-10-CM | POA: Insufficient documentation

## 2015-08-17 DIAGNOSIS — Z880 Allergy status to penicillin: Secondary | ICD-10-CM | POA: Diagnosis not present

## 2015-08-17 MED ORDER — HYDROCODONE-ACETAMINOPHEN 5-325 MG PO TABS: 1.0000 | ORAL_TABLET | Freq: Four times a day (QID) | ORAL | Status: AC | PRN

## 2015-08-17 MED ORDER — LIDOCAINE-EPINEPHRINE (PF) 2 %-1:200000 IJ SOLN
10.0000 mL | Freq: Once | INTRAMUSCULAR | Status: AC
  Administered 2015-08-17: 10 mL via INTRADERMAL
  Filled 2015-08-17: qty 20

## 2015-08-17 MED ORDER — TETANUS-DIPHTH-ACELL PERTUSSIS 5-2.5-18.5 LF-MCG/0.5 IM SUSP
0.5000 mL | Freq: Once | INTRAMUSCULAR | Status: AC
  Administered 2015-08-17: 0.5 mL via INTRAMUSCULAR
  Filled 2015-08-17: qty 0.5

## 2015-08-17 NOTE — Discharge Instructions (Signed)
Please remove packing in 2 days.  Apply warm compress to affected area several times daily to promote healing.  Return if your condition worsen.  Take pain medication only as needed but be aware that it can cause drowsiness.  Epidermal Cyst An epidermal cyst is sometimes called a sebaceous cyst, epidermal inclusion cyst, or infundibular cyst. These cysts usually contain a substance that looks "pasty" or "cheesy" and may have a bad smell. This substance is a protein called keratin. Epidermal cysts are usually found on the face, neck, or trunk. They may also occur in the vaginal area or other parts of the genitalia of both men and women. Epidermal cysts are usually small, painless, slow-growing bumps or lumps that move freely under the skin. It is important not to try to pop them. This may cause an infection and lead to tenderness and swelling. CAUSES  Epidermal cysts may be caused by a deep penetrating injury to the skin or a plugged hair follicle, often associated with acne. SYMPTOMS  Epidermal cysts can become inflamed and cause:  Redness.  Tenderness.  Increased temperature of the skin over the bumps or lumps.  Grayish-white, bad smelling material that drains from the bump or lump. DIAGNOSIS  Epidermal cysts are easily diagnosed by your caregiver during an exam. Rarely, a tissue sample (biopsy) may be taken to rule out other conditions that may resemble epidermal cysts. TREATMENT   Epidermal cysts often get better and disappear on their own. They are rarely ever cancerous.  If a cyst becomes infected, it may become inflamed and tender. This may require opening and draining the cyst. Treatment with antibiotics may be necessary. When the infection is gone, the cyst may be removed with minor surgery.  Small, inflamed cysts can often be treated with antibiotics or by injecting steroid medicines.  Sometimes, epidermal cysts become large and bothersome. If this happens, surgical removal in your  caregiver's office may be necessary. HOME CARE INSTRUCTIONS  Only take over-the-counter or prescription medicines as directed by your caregiver.  Take your antibiotics as directed. Finish them even if you start to feel better. SEEK MEDICAL CARE IF:   Your cyst becomes tender, red, or swollen.  Your condition is not improving or is getting worse.  You have any other questions or concerns. MAKE SURE YOU:  Understand these instructions.  Will watch your condition.  Will get help right away if you are not doing well or get worse.   This information is not intended to replace advice given to you by your health care provider. Make sure you discuss any questions you have with your health care provider.   Document Released: 03/07/2004 Document Revised: 06/29/2011 Document Reviewed: 10/13/2010 Elsevier Interactive Patient Education [employer]2016 [employer] Inc[employer]

## 2015-08-17 NOTE — ED Provider Notes (Signed)
CSN: [id number]     Arrival date & time 08/17/15  [id number] History   First MD Initiated Contact with Patient 08/17/15 [phone]     Chief Complaint  Patient presents with  . Abscess     (Consider location/radiation/quality/duration/timing/severity/associated sxs/prior Treatment) HPI   35 year old female who presents for evaluation for a boil in her left armpit. Patient states she noticed a lump in her left armpit for more than a year. She did discuss this finding with her doctor a year ago and her doctor told her to return if it becomes painful or change. In the past 2 weeks she has had progressive worsening sharp throbbing pain to the affected area with increased swelling. Pain worsening with palpation and with all movement. Pain is nonradiating. She described pain as 7 out of 10. She has not noticed any drainage. She has tried over-the-counter medication and rubbing alcohol without adequate relief. She denies any history of cancer. She is not a smoker. She did report having both in weight loss, total 5 pound weight loss for the past several months, and attributed to her eating habit. She endorse night sweats but states that does not unusual for her and she denies fever. No prior history of abscess. No fever, chills, chest pain, shortness of breath, or rash. No recent strenuous activities or injuries. Patient unable to recall her last tetanus status.  Past Medical History  Diagnosis Date  . Arthritis   . Seasonal allergies    Past Surgical History  Procedure Laterality Date  . Shoulder surgery Right    Family History  Problem Relation Age of Onset  . Autism Son    Social History  Substance Use Topics  . Smoking status: Never Smoker   . Smokeless tobacco: None  . Alcohol Use: No   OB History    Gravida Para Term Preterm AB TAB SAB Ectopic Multiple Living   3 2 2  1  1   2      Review of Systems  Constitutional: Negative for fever.  Respiratory: Negative for shortness of breath.    Cardiovascular: Negative for chest pain.      Allergies  Penicillins  Home Medications   Prior to Admission medications   Medication Sig Start Date End Date Taking? Authorizing Provider  albuterol (PROAIR HFA) 108 (90 BASE) MCG/ACT inhaler Inhale 2 puffs into the lungs 2 (two) times daily as needed for wheezing or shortness of breath.    Historical Provider, MD  aspirin-acetaminophen-caffeine (EXCEDRIN MIGRAINE) [phone] MG per tablet Take 2 tablets by mouth 2 (two) times daily as needed (pain).    Historical Provider, MD  citalopram (CELEXA) 20 MG tablet Take 1 tablet (20 mg total) by mouth daily. 12/28/13   Brock Badharles A Harper, MD  etonogestrel-ethinyl estradiol (NUVARING) 0.12-0.015 MG/24HR vaginal ring Insert vaginally and leave in place for 3 consecutive weeks, then remove for 1 week. Patient not taking: Reported on 06/14/2014 03/23/14   Brock Badharles A Harper, MD  fluticasone [facility](FLONASE) 50 MCG/ACT nasal spray Place 2 sprays into both nostrils daily.    Historical Provider, MD  HYDROcodone-acetaminophen (NORCO/VICODIN) 5-325 MG per tablet Take 1 tablet by mouth every 4 (four) hours as needed. Patient not taking: Reported on 05/07/2015 11/05/13   Kristen N Ward, DO  ibuprofen (ADVIL,MOTRIN) 800 MG tablet Take 1 tablet (800 mg total) by mouth every 8 (eight) hours as needed for mild pain. 05/07/15   Lyndal Pulleyaniel Knott, MD  Multiple Vitamin (MULTIVITAMIN WITH MINERALS) TABS Take 1 tablet by mouth  daily.    Historical Provider, MD  Olopatadine HCl (PATADAY) 0.2 % SOLN Place 1 drop into both eyes daily.     Historical Provider, MD   BP 130/85 mmHg  Pulse 71  Temp(Src) 98.7 F (37.1 C) (Oral)  Resp 18  Ht  (1.778 m)  Wt 105.28 kg  BMI 33.30 kg/m2  SpO2 100% Physical Exam  Constitutional: She appears well-developed and well-nourished. No distress.  HENT:  Head: Atraumatic.  Eyes: Conjunctivae are normal.  Neck: Neck supple.  Neurological: She is alert.  Skin: No rash noted.  Left axillary  region: A pea size subcutaneous nodule noted to left axillary fold that is exquisitely tender to palpation but no surrounding skin erythema and no obvious fluctuance appreciated.  Psychiatric: She has a normal mood and affect.  Nursing note and vitals reviewed.   ED Course  Procedures (including critical care time)  INCISION AND DRAINAGE Performed by: Fayrene Helper Consent: Verbal consent obtained. Risks and benefits: risks, benefits and alternatives were discussed Type: abscess  Body area: L axillary fold  Anesthesia: local infiltration  Incision was made with a scalpel.  Local anesthetic: lidocaine 2% w epinephrine  Anesthetic total: 3 ml  Complexity: complex Blunt dissection to break up loculations  Drainage: purulent  Drainage amount: moderate  Packing material: 1/4 in iodoform gauze  Patient tolerance: Patient tolerated the procedure well with no immediate complications.    MDM   Final diagnoses:  Infected epidermoid cyst    BP 130/85 mmHg  Pulse 71  Temp(Src) 98.7 F (37.1 C) (Oral)  Resp 18  Ht  (1.778 m)  Wt 105.28 kg  BMI 33.30 kg/m2  SpO2 100%   8:12 AM Pt here with a subcutaneous nodule in her L axillary fold, likely an abscess and less likely malignancy.  Will perform I&D.  tdap update.    8:36 AM Successful I&D.  Pt has an infected epidermal cyst. No surrounding cellulitis.  Packing placed.  Wound care explained.  Return in 48 hrs for recheck.    Fayrene Helper, PA-C 08/17/15 1610  Lorre Nick, MD 08/23/15 1400

## 2015-08-17 NOTE — ED Notes (Signed)
Patient here with abscess under left arm x 6 montths, reports that she has just developed pain with same. No drainage

## 2015-08-17 NOTE — ED Notes (Signed)
Pt states that she is experiencing intermittent cramping in the calf of her right leg. RN notified.

## 2015-08-17 NOTE — ED Notes (Signed)
PA Tran at bedside. 

## 2017-06-23 ENCOUNTER — Emergency Department (HOSPITAL_COMMUNITY)
Admission: EM | Admit: 2017-06-23 | Discharge: 2017-06-23 | Disposition: A | Discharge status: Home / Self Care | Payer: Medicaid Other | Attending: Emergency Medicine | Admitting: Emergency Medicine

## 2017-06-23 ENCOUNTER — Encounter (HOSPITAL_COMMUNITY): Payer: Self-pay

## 2017-06-23 ENCOUNTER — Other Ambulatory Visit: Payer: Self-pay

## 2017-06-23 DIAGNOSIS — L509 Urticaria, unspecified: Secondary | ICD-10-CM | POA: Diagnosis not present

## 2017-06-23 DIAGNOSIS — Z79899 Other long term (current) drug therapy: Secondary | ICD-10-CM | POA: Insufficient documentation

## 2017-06-23 DIAGNOSIS — I10 Essential (primary) hypertension: Secondary | ICD-10-CM | POA: Insufficient documentation

## 2017-06-23 MED ORDER — DIPHENHYDRAMINE HCL 50 MG/ML IJ SOLN
25.0000 mg | Freq: Once | INTRAMUSCULAR | Status: AC
  Administered 2017-06-23: 25 mg via INTRAVENOUS
  Filled 2017-06-23: qty 1

## 2017-06-23 MED ORDER — LORATADINE 10 MG PO TABS: 10.0000 mg | ORAL_TABLET | Freq: Every day | ORAL | 0 refills | Status: AC

## 2017-06-23 MED ORDER — RANITIDINE HCL 150 MG PO CAPS: 150.0000 mg | ORAL_CAPSULE | Freq: Every day | ORAL | 0 refills | Status: AC

## 2017-06-23 MED ORDER — PREDNISONE 10 MG (21) PO TBPK: ORAL_TABLET | Freq: Every day | ORAL | 0 refills | Status: AC

## 2017-06-23 MED ORDER — METHYLPREDNISOLONE SODIUM SUCC 125 MG IJ SOLR
125.0000 mg | Freq: Once | INTRAMUSCULAR | Status: AC
  Administered 2017-06-23: 125 mg via INTRAVENOUS
  Filled 2017-06-23: qty 2

## 2017-06-23 MED ORDER — FAMOTIDINE IN NACL 20-0.9 MG/50ML-% IV SOLN
20.0000 mg | Freq: Once | INTRAVENOUS | Status: AC
  Administered 2017-06-23: 20 mg via INTRAVENOUS
  Filled 2017-06-23: qty 50

## 2017-06-23 NOTE — ED Triage Notes (Signed)
Pt endorses cold sx recently and took cold medicine nyquil cold and sinus and began having generalized rash/hives and shob. Airway intact. Tachy in triage. Only known allergy is pcn.

## 2017-06-23 NOTE — ED Provider Notes (Signed)
Patient placed in Quick Look pathway, seen and evaluated   Chief Complaint: allergic reaction  HPI:   37 y.o. female presents to the ED with shortness of breath, hives and itching. Patient reports that she started taking NyQuil yesterday for congestion and cough and later noted a rash. Today the rash is worse and patient reports shortness of breath.   ROS: Resp: shortness of breath  Skin: hives  Gen: No distress  Neuro: Awake and Alert  Skin: Warm   Physical Exam: BP (!) 143/93 (BP Location: Right Arm) Comment: Simultaneous filing. User may not have seen previous data. Comment (BP Location): Simultaneous filing. User may not have seen previous data.  Pulse (!) 107 Comment: Simultaneous filing. User may not have seen previous data.  Temp 98 F (36.7 C) (Oral) Comment: Simultaneous filing. User may not have seen previous data. Comment (Src): Simultaneous filing. User may not have seen previous data.  Resp 20 Comment: Simultaneous filing. User may not have seen previous data.  Ht 5\' 11"  (1.803 m)   Wt 108.9 kg (240 lb)   LMP 05/30/2017 (Exact Date)   SpO2 99% Comment: Simultaneous filing. User may not have seen previous data.  BMI 33.47 kg/m     Focused Exam:  Patient with generalized hives, lungs decreased breath sounds, no wheezing heard.   Initiation of care has begun. The patient has been counseled on the process, plan, and necessity for staying for the completion/evaluation, and the remainder of the medical screening examination   Janne Napoleoneese, Hope M, NP 06/23/17 1729

## 2017-06-23 NOTE — ED Notes (Signed)
Pt reports that she feels better.

## 2017-06-23 NOTE — ED Notes (Signed)
Pt up to the br  C/o some itching and swelling to her rt hand that just started again

## 2017-06-23 NOTE — Discharge Instructions (Signed)
Please read attached information. If you experience any new or worsening signs or symptoms please return to the emergency room for evaluation. Please follow-up with your primary care provider or specialist as discussed. Please use medication prescribed only as directed and discontinue taking if you have any concerning signs or symptoms.   °

## 2017-06-23 NOTE — ED Provider Notes (Signed)
[facility] EMERGENCY DEPARTMENT Provider Note   CSN: [id number] Arrival date & time: 06/23/17  1714     History   Chief Complaint Chief Complaint  Patient presents with  . Allergic Reaction  . Rash    HPI Evelyn Olsen is a 37 y.o. female.  HPI     37 year old female presents today with complaints of hives.  Patient notes symptoms started 1 day ago with hives to her hands and upper extremities.  She notes she has some along her face as well.  Its most recently have spread to her neck and back.  Patient denies any new medications, sure she has had 3 days of recent upper respiratory congestion, rhinorrhea and nonproductive cough.  Patient reports initially she did have a fever, no longer having fever.  She notes she has been taking DayQuil and NyQuil recently without significant improvement in her rash.  Patient denies any new medications, lotions, soap, foods or any other abnormal exposures.  She denies any history of the same.      Past Medical History:  Diagnosis Date  . Arthritis   . Seasonal allergies     Patient Active Problem List   Diagnosis Date Noted  . Depressive disorder, not elsewhere classified 12/28/2013  . Hypertension 03/31/2013    Past Surgical History:  Procedure Laterality Date  . SHOULDER SURGERY Right     OB History    Gravida Para Term Preterm AB Living   3 2 2   1 2    SAB TAB Ectopic Multiple Live Births   1       2       Home Medications    Prior to Admission medications   Medication Sig Start Date End Date Taking? Authorizing Provider  albuterol (PROAIR HFA) 108 (90 BASE) MCG/ACT inhaler Inhale 2 puffs into the lungs 2 (two) times daily as needed for wheezing or shortness of breath.   Yes [provider]  aspirin-acetaminophen-caffeine (EXCEDRIN MIGRAINE) [phone] MG per tablet Take 2 tablets by mouth 2 (two) times daily as needed (pain).   Yes [provider]  citalopram (CELEXA) 20 MG  tablet Take 1 tablet (20 mg total) by mouth daily. 12/28/13  Yes Brock Bad, MD  diphenhydrAMINE (BENADRYL) 12.5 MG/5ML liquid Take 25 mg by mouth 4 (four) times daily as needed (for allergic reactions).   Yes [provider]  DM-Doxylamine-Acetaminophen (NYQUIL COLD & FLU PO) Take 2 capsules by mouth every 6 (six) hours as needed (for cold and/or flu-like symptoms).    Yes [provider]  fexofenadine (ALLEGRA) 180 MG tablet Take 180 mg by mouth daily.   Yes [provider]  fluticasone (FLONASE) 50 MCG/ACT nasal spray Place 2 sprays into both nostrils daily.   Yes [provider]  HYDROcodone-acetaminophen (NORCO/VICODIN) 5-325 MG tablet Take 1 tablet by mouth every 6 (six) hours as needed for moderate pain. 08/17/15  Yes Fayrene Helper, PA-C  Multiple Vitamin (MULTIVITAMIN WITH MINERALS) TABS Take 1 tablet by mouth daily.   Yes [provider]  Olopatadine HCl (PATADAY) 0.2 % SOLN Place 1 drop into both eyes daily.    Yes [provider]  etonogestrel-ethinyl estradiol (NUVARING) 0.12-0.015 MG/24HR vaginal ring Insert vaginally and leave in place for 3 consecutive weeks, then remove for 1 week. Patient not taking: Reported on 06/23/2017 03/23/14   Brock Bad, MD  ibuprofen (ADVIL,MOTRIN) 800 MG tablet Take 1 tablet (800 mg total) by mouth every 8 (eight)  hours as needed for mild pain. Patient not taking: Reported on 06/23/2017 05/07/15   Lyndal PulleyKnott, Daniel, MD  loratadine (CLARITIN) 10 MG tablet Take 1 tablet (10 mg total) by mouth daily. 06/23/17   Taralyn Ferraiolo, Tinnie GensJeffrey, PA-C  predniSONE (STERAPRED UNI-PAK 21 TAB) 10 MG (21) TBPK tablet Take by mouth daily. Take 4  tabs by mouth daily for 3 days, then 3 tabs for 2 days, then 2 tabs for 2 days, then 1 tabs for 2 days, 06/23/17   Alichia Alridge, Tinnie GensJeffrey, PA-C  ranitidine (ZANTAC) 150 MG capsule Take 1 capsule (150 mg total) by mouth daily. 06/23/17   Eyvonne MechanicHedges, Monzerrath Mcburney, PA-C    Family History Family History  Problem  Relation Age of Onset  . Autism Son     Social History Social History   Tobacco Use  . Smoking status: Never Smoker  Substance Use Topics  . Alcohol use: No  . Drug use: No     Allergies   Penicillins   Review of Systems Review of Systems  All other systems reviewed and are negative.    Physical Exam Updated Vital Signs BP 109/75   Pulse 99   Temp 98 F (36.7 C) (Oral) Comment: Simultaneous filing. User may not have seen previous data. Comment (Src): Simultaneous filing. User may not have seen previous data.  Resp 20 Comment: Simultaneous filing. User may not have seen previous data.  Ht 5\' 11"  (1.803 m)   Wt 108.9 kg (240 lb)   LMP 05/30/2017 (Exact Date)   SpO2 98%   BMI 33.47 kg/m   Physical Exam  Constitutional: She is oriented to person, place, and time. She appears well-developed and well-nourished.  HENT:  Head: Normocephalic and atraumatic.  No angioedema, no intraoral swelling  Eyes: Conjunctivae are normal. Pupils are equal, round, and reactive to light. Right eye exhibits no discharge. Left eye exhibits no discharge. No scleral icterus.  Neck: Normal range of motion. No JVD present. No tracheal deviation present.  Pulmonary/Chest: Effort normal and breath sounds normal. No stridor. No respiratory distress. She has no wheezes. She has no rales. She exhibits no tenderness.  Neurological: She is alert and oriented to person, place, and time. Coordination normal.  Skin:  Generalized hives noted on the face hands upper extremities chest, back and feet  Psychiatric: She has a normal mood and affect. Her behavior is normal. Judgment and thought content normal.  Nursing note and vitals reviewed.    ED Treatments / Results  Labs (all labs ordered are listed, but only abnormal results are displayed) Labs Reviewed - No data to display  EKG  EKG Interpretation None       Radiology No results found.  Procedures Procedures (including critical care  time)  Medications Ordered in ED Medications  methylPREDNISolone sodium succinate (SOLU-MEDROL) 125 mg/2 mL injection 125 mg (125 mg Intravenous Given 06/23/17 1733)  famotidine (PEPCID) IVPB 20 mg premix (0 mg Intravenous Stopped 06/23/17 1949)  diphenhydrAMINE (BENADRYL) injection 25 mg (25 mg Intravenous Given 06/23/17 1733)     Initial Impression / Assessment and Plan / ED Course  I have reviewed the triage vital signs and the nursing notes.  Pertinent labs & imaging results that were available during my care of the patient were reviewed by me and considered in my medical decision making (see chart for details).      Final Clinical Impressions(s) / ED Diagnoses   Final diagnoses:  Hives    Labs:   Imaging:  Consults:  Therapeutics: Benadryl,  Pepcid, Solu-Medrol  Discharge Meds: Prednisone, Claritin, Zantac  Assessment/Plan: 37 year old female presents today with complaints of rash.  Patient does have hives throughout up into the face, she has no intraoral involvement she has clear lung sounds with no shortness of breath, no signs of angioedema.  Uncertain etiology, although I question viral upper respiratory infection.  Patient had symptomatic improvement here with the above medications.  She will be discharged home with steroids, Claritin, Zantac.  Patient will return immediately with any new or worsening signs or symptoms, she will follow-up as an outpatient for allergy testing.  She is given strict return precautions, she verbalized understanding and agreement to today's plan had no further questions or concerns at the time of discharge.   ED Discharge Orders        Ordered    ranitidine (ZANTAC) 150 MG capsule  Daily     06/23/17 2140    loratadine (CLARITIN) 10 MG tablet  Daily     06/23/17 2140    predniSONE (STERAPRED UNI-PAK 21 TAB) 10 MG (21) TBPK tablet  Daily     06/23/17 2140       Eyvonne Mechanic, PA-C 06/23/17 2143    Linwood Dibbles, MD 06/24/17 [phone]

## 2018-05-10 ENCOUNTER — Other Ambulatory Visit: Payer: Self-pay | Admitting: Physician Assistant

## 2018-05-10 DIAGNOSIS — Z1231 Encounter for screening mammogram for malignant neoplasm of breast: Secondary | ICD-10-CM

## 2018-06-08 ENCOUNTER — Ambulatory Visit: Payer: Medicaid Other

## 2019-02-01 ENCOUNTER — Other Ambulatory Visit: Payer: Self-pay | Admitting: Adult Health Nurse Practitioner

## 2019-02-01 DIAGNOSIS — B3789 Other sites of candidiasis: Secondary | ICD-10-CM

## 2019-04-28 ENCOUNTER — Ambulatory Visit: Payer: Medicaid Other

## 2019-05-23 ENCOUNTER — Other Ambulatory Visit: Payer: Self-pay | Admitting: Physician Assistant

## 2019-05-23 DIAGNOSIS — Z1231 Encounter for screening mammogram for malignant neoplasm of breast: Secondary | ICD-10-CM

## 2019-06-30 ENCOUNTER — Ambulatory Visit
Admission: RE | Admit: 2019-06-30 | Discharge: 2019-06-30 | Disposition: A | Discharge status: Home / Self Care | Payer: Medicaid Other | Source: Ambulatory Visit | Attending: Physician Assistant | Admitting: Physician Assistant

## 2019-06-30 ENCOUNTER — Other Ambulatory Visit: Payer: Self-pay

## 2019-06-30 DIAGNOSIS — Z1231 Encounter for screening mammogram for malignant neoplasm of breast: Secondary | ICD-10-CM

## 2020-10-07 ENCOUNTER — Emergency Department (HOSPITAL_COMMUNITY)
Admission: EM | Admit: 2020-10-07 | Discharge: 2020-10-07 | Disposition: A | Discharge status: Home / Self Care | Payer: Medicaid Other | Attending: Emergency Medicine | Admitting: Emergency Medicine

## 2020-10-07 ENCOUNTER — Other Ambulatory Visit: Payer: Self-pay

## 2020-10-07 ENCOUNTER — Emergency Department (HOSPITAL_COMMUNITY): Payer: Medicaid Other

## 2020-10-07 ENCOUNTER — Encounter (HOSPITAL_COMMUNITY): Payer: Self-pay | Admitting: *Deleted

## 2020-10-07 DIAGNOSIS — I10 Essential (primary) hypertension: Secondary | ICD-10-CM | POA: Insufficient documentation

## 2020-10-07 DIAGNOSIS — Z79899 Other long term (current) drug therapy: Secondary | ICD-10-CM | POA: Diagnosis not present

## 2020-10-07 DIAGNOSIS — M79671 Pain in right foot: Secondary | ICD-10-CM

## 2020-10-07 DIAGNOSIS — W010XXA Fall on same level from slipping, tripping and stumbling without subsequent striking against object, initial encounter: Secondary | ICD-10-CM | POA: Diagnosis not present

## 2020-10-07 DIAGNOSIS — M25561 Pain in right knee: Secondary | ICD-10-CM | POA: Diagnosis not present

## 2020-10-07 MED ORDER — LIDOCAINE 5 % EX PTCH: 1.0000 | MEDICATED_PATCH | CUTANEOUS | 0 refills | Status: AC

## 2020-10-07 MED ORDER — ACETAMINOPHEN 325 MG PO TABS
650.0000 mg | ORAL_TABLET | Freq: Once | ORAL | Status: AC
  Administered 2020-10-07: 650 mg via ORAL
  Filled 2020-10-07: qty 2

## 2020-10-07 MED ORDER — MELOXICAM 7.5 MG PO TABS: 7.5000 mg | ORAL_TABLET | Freq: Every day | ORAL | 0 refills | Status: AC

## 2020-10-07 NOTE — ED Notes (Signed)
RN reviewed discharge instructions w/ pt. Knee brace and pain meds reviewed. Pt had no further questions

## 2020-10-07 NOTE — ED Provider Notes (Signed)
Emergency Medicine Provider Triage Evaluation Note  Evelyn Olsen , a 40 y.o. female  was evaluated in triage.  Pt complains of 2 complaints. 1: Right knee pain: She reports that she has been following with an orthopedist and going to therapy while awaiting surgery.  She slipped on water on her kitchen floor about 2 weeks ago and since then has had worsening in her pain.  She has not had any x-rays since this happened. 2: Right foot pain: She reports that about a month ago she dropped a table on her right foot.  She is continuing to have swelling and pain in the foot with a visible lump.  She reports She reports that these 2 problems combined are making it difficult for her to ambulate due to pain.  She has been trying Bengay without relief..  Review of Systems  Positive: Right foot and knee pain Negative: Fever, wound  Physical Exam  BP (!) 154/103 (BP Location: Right Arm)   Pulse 79   Temp 98.5 F (36.9 C)   Resp 17   LMP 10/06/2020   SpO2 100%  Gen:   Awake, no distress   Resp:  Normal effort  MSK:   Moves extremities without difficulty  Other:  Obvious edema over the dorsum of the right foot.  Able to move toes on left foot.   Medical Decision Making  Medically screening exam initiated at 12:28 PM.  Appropriate orders placed.  Evelyn Olsen was informed that the remainder of the evaluation will be completed by another provider, this initial triage assessment does not replace that evaluation, and the importance of remaining in the ED until their evaluation is complete.  Note: Portions of this report may have been transcribed using voice recognition software. Every effort was made to ensure accuracy; however, inadvertent computerized transcription errors may be present    Evelyn Olsen 10/07/20 1230    Evelyn Barrette, MD 10/16/20 [phone]

## 2020-10-07 NOTE — ED Provider Notes (Signed)
[facility] EMERGENCY DEPARTMENT Provider Note   CSN: [id number] Arrival date & time: 10/07/20  1009     History Chief Complaint  Patient presents with   Knee Pain   Foot Pain    Evelyn Olsen is a 40 y.o. female With no significant past medical history who presents for evaluation of 2 separate complaints.  Has had chronic right knee pain.  Was previously followed by Dr. Jerl Santos with orthopedics.  Was told previously that she would need surgery to have a knee replacement.  Patient states approximately 2 weeks ago she slipped and fell and landed on this knee.  She has had continued pain since.  She has not followed up with orthopedics.  She was not assessed at that time.  She denies any paresthesias or weakness.  Her knee pain is worse when she flexes and extends at her knees.  Worse when she goes upstairs.  She feels like her knee catches when she she goes up and down stairs.  Patient states she has had right foot pain.  She dropped a table on her foot and had noted a knot to the dorsal aspect of her foot since.  No overlying erythema or warmth.  She has been ambulatory without difficulty  No fever, chills, nausea, vomiting, redness, warmth, chest pain, shortness of breath.  Denies additional aggravating or alleviating factors.  History obtained from patient and past medical records.  No interpreter used.  HPI     Past Medical History:  Diagnosis Date   Arthritis    Seasonal allergies     Patient Active Problem List   Diagnosis Date Noted   Depressive disorder, not elsewhere classified 12/28/2013   Hypertension 03/31/2013    Past Surgical History:  Procedure Laterality Date   SHOULDER SURGERY Right      OB History     Gravida  3   Para  2   Term  2   Preterm      AB  1   Living  2      SAB  1   IAB      Ectopic      Multiple      Live Births  2           Family History  Problem Relation Age of Onset   Autism Son      Social History   Tobacco Use   Smoking status: Never  Substance Use Topics   Alcohol use: No   Drug use: No    Home Medications Prior to Admission medications   Medication Sig Start Date End Date Taking? Authorizing Provider  lidocaine (LIDODERM) 5 % Place 1 patch onto the skin daily. Remove & Discard patch within 12 hours or as directed by MD 10/07/20  Yes Ozias Dicenzo A, PA-C  meloxicam (MOBIC) 7.5 MG tablet Take 1 tablet (7.5 mg total) by mouth daily for 15 days. 10/07/20 10/22/20 Yes Ramar Nobrega A, PA-C  albuterol (PROAIR HFA) 108 (90 BASE) MCG/ACT inhaler Inhale 2 puffs into the lungs 2 (two) times daily as needed for wheezing or shortness of breath.    [provider]  Ascorbic Acid (VITAMIN C) POWD Take 5 g by mouth daily.    [provider]  aspirin-acetaminophen-caffeine (EXCEDRIN MIGRAINE) [phone] MG per tablet Take 2 tablets by mouth 2 (two) times daily as needed (pain).    [provider]  citalopram (CELEXA) 20 MG tablet Take 1 tablet (20 mg total) by  mouth daily. 12/28/13   Brock BadHarper, Charles A, MD  diphenhydrAMINE (BENADRYL) 12.5 MG/5ML liquid Take 25 mg by mouth 4 (four) times daily as needed (for allergic reactions).    [provider]  DM-Doxylamine-Acetaminophen (NYQUIL COLD & FLU PO) Take 2 capsules by mouth every 6 (six) hours as needed (for cold and/or flu-like symptoms).     [provider]  etonogestrel-ethinyl estradiol (NUVARING) 0.12-0.015 MG/24HR vaginal ring Insert vaginally and leave in place for 3 consecutive weeks, then remove for 1 week. Patient not taking: Reported on 06/23/2017 03/23/14   Brock BadHarper, Charles A, MD  fexofenadine (ALLEGRA) 180 MG tablet Take 180 mg by mouth daily.    [provider]  fluticasone (FLONASE) 50 MCG/ACT nasal spray Place 2 sprays into both nostrils daily.    [provider]  HYDROcodone-acetaminophen (NORCO/VICODIN) 5-325 MG tablet Take 1 tablet by mouth every 6  (six) hours as needed for moderate pain. 08/17/15   Fayrene Helperran, Bowie, PA-C  ibuprofen (ADVIL,MOTRIN) 800 MG tablet Take 1 tablet (800 mg total) by mouth every 8 (eight) hours as needed for mild pain. Patient not taking: Reported on 06/23/2017 05/07/15   Lyndal PulleyKnott, Daniel, MD  loratadine (CLARITIN) 10 MG tablet Take 1 tablet (10 mg total) by mouth daily. 06/23/17   Hedges, Tinnie GensJeffrey, PA-C  Multiple Vitamin (MULTIVITAMIN WITH MINERALS) TABS Take 1 tablet by mouth daily.    [provider]  Multiple Vitamins-Minerals (ALIVE WOMENS GUMMY) CHEW Chew 1 tablet by mouth daily.    [provider]  Olopatadine HCl (PATADAY) 0.2 % SOLN Place 1 drop into both eyes daily.     [provider]  predniSONE (STERAPRED UNI-PAK 21 TAB) 10 MG (21) TBPK tablet Take by mouth daily. Take 4  tabs by mouth daily for 3 days, then 3 tabs for 2 days, then 2 tabs for 2 days, then 1 tabs for 2 days, 06/23/17   Hedges, Tinnie GensJeffrey, PA-C  ranitidine (ZANTAC) 150 MG capsule Take 1 capsule (150 mg total) by mouth daily. 06/23/17   Hedges, Tinnie GensJeffrey, PA-C    Allergies    Penicillins  Review of Systems   Review of Systems  Constitutional: Negative.   HENT: Negative.    Respiratory: Negative.    Cardiovascular: Negative.   Gastrointestinal: Negative.   Genitourinary: Negative.   Musculoskeletal:        Right knee pain, right foot pain.  Skin: Negative.   Neurological: Negative.   All other systems reviewed and are negative.  Physical Exam Updated Vital Signs BP (!) 154/103 (BP Location: Right Arm)   Pulse 79   Temp 98.5 F (36.9 C)   Resp 17   LMP 10/06/2020   SpO2 100%   Physical Exam Vitals and nursing note reviewed.  Constitutional:      General: She is not in acute distress.    Appearance: She is well-developed. She is not ill-appearing, toxic-appearing or diaphoretic.  HENT:     Head: Normocephalic and atraumatic.     Nose: Nose normal.     Mouth/Throat:     Mouth: Mucous membranes are moist.  Eyes:      Pupils: Pupils are equal, round, and reactive to light.  Cardiovascular:     Rate and Rhythm: Normal rate.     Pulses: Normal pulses.          Radial pulses are 2+ on the right side and 2+ on the left side.       Dorsalis pedis pulses are 2+ on the right  side and 2+ on the left side.     Heart sounds: Normal heart sounds.  Pulmonary:     Effort: Pulmonary effort is normal. No respiratory distress.     Breath sounds: Normal breath sounds and air entry.  Abdominal:     General: There is no distension.  Musculoskeletal:        General: Normal range of motion.     Cervical back: Normal range of motion.     Comments: Diffuse tenderness to right knee.  Able to flex and extend without difficulty.  Negative anterior drawer.  No bony tenderness to tib-fib, femur.  Tenderness to dorsum right foot.  To mild overlying swelling.  No overlying erythema, warmth, fluctuance or induration.  Able to flex and extend at bilateral ankles without difficulty.  Wiggles toes without difficulty.  Skin:    General: Skin is warm and dry.     Capillary Refill: Capillary refill takes less than 2 seconds.     Comments: No rash, lesions, erythema or warmth  Neurological:     General: No focal deficit present.     Mental Status: She is alert.     Comments: Equal strength bilaterally Intact sensation Ambulatory without ataxic gait  Psychiatric:        Mood and Affect: Mood normal.    ED Results / Procedures / Treatments   Labs (all labs ordered are listed, but only abnormal results are displayed) Labs Reviewed - No data to display  EKG None  Radiology DG Knee Complete 4 Views Right  Result Date: 10/07/2020 CLINICAL DATA:  Arthritis, worsening pain after falling 2 weeks ago. EXAM: RIGHT KNEE - COMPLETE 4+ VIEW COMPARISON:  Radiographs 12/31/2017 FINDINGS: The mineralization and alignment are normal. There is no evidence of acute fracture or dislocation. Mildly progressive medial and patellofemoral joint  space narrowing and osteophyte formation. There is spurring at the quadriceps insertion on the patella. Underlying patella alta noted without knee joint effusion or focal soft tissue abnormality. IMPRESSION: No acute osseous findings. Mildly progressive degenerative changes. Underlying patella alta. Electronically Signed   By: Carey Bullocks M.D.   On: 10/07/2020 13:10   DG Foot Complete Right  Result Date: 10/07/2020 CLINICAL DATA:  Worsening pain since foot injury 1 month ago. EXAM: RIGHT FOOT COMPLETE - 3+ VIEW COMPARISON:  Right foot radiographs 06/17/2015 FINDINGS: The mineralization and alignment are normal. There is no evidence of acute fracture or dislocation. There are mild midfoot and 1st MTP joint degenerative changes. Small plantar calcaneal spur noted. No evidence of foreign body or soft tissue emphysema. IMPRESSION: No acute osseous findings.  Mild degenerative changes as described. Electronically Signed   By: Carey Bullocks M.D.   On: 10/07/2020 13:11    Procedures .Ortho Injury Treatment  Date/Time: 10/07/2020 2:57 PM Performed by: Ralph Leyden A, PA-C Authorized by: Linwood Dibbles, PA-C   Consent:    Consent obtained:  Verbal   Consent given by:  Patient   Risks discussed:  Fracture, nerve damage, irreducible dislocation, recurrent dislocation, stiffness, restricted joint movement and vascular damage   Alternatives discussed:  No treatment, alternative treatment, referral, immobilization and delayed treatmentInjury location: knee Location details: right knee Injury type: soft tissue Pre-procedure neurovascular assessment: neurovascularly intact Pre-procedure distal perfusion: normal Pre-procedure neurological function: normal Pre-procedure range of motion: normal  Anesthesia: Local anesthesia used: no  Patient sedated: NoSplint Applied by: Milon Dikes Post-procedure neurovascular assessment: post-procedure neurovascularly intact Post-procedure distal perfusion:  normal Post-procedure neurological function: normal Post-procedure range  of motion: normal     Medications Ordered in ED Medications - No data to display  ED Course  I have reviewed the triage vital signs and the nursing notes.  Pertinent labs & imaging results that were available during my care of the patient were reviewed by me and considered in my medical decision making (see chart for details).  40 year old here for evaluation of right knee pain, right foot pain.  She has chronic right knee pain, previously followed by orthopedics told she needed prior knee replacement.  Mechanical fall 2 weeks ago landing on her right knee.  She is a nonfocal neuro exam without deficits.  Diffuse tenderness to right knee without any overlying erythema or warmth.  No obvious evidence of acute tendon, ligament rupture.  No obvious effusion.  Low suspicion for septic joint, gout, hemarthrosis, occult fracture, DVT, myositis.  Her calves are soft, nontender bilaterally.  She is without tachycardia, tachypnea or hypoxia.  Low suspicion for VTE.  Diffuse tenderness to dorsum right foot with some overlying soft tissue swelling.  Again no overlying skin changes.  She is amatory without difficulty.  No obvious infectious process.  Imaging reviewed and interpreted:  X-ray foot without acute fracture or dislocation Knee x-ray without evidence of acute effusion, fracture, dislocation.  She does have degenerative changes  Reassuring imaging.  Do not feel patient needs further imaging here in the emergency department.  Will start on Mobic, lidocaine patches and have her follow-up with orthopedics.  Patient agreeable.  Placed in knee sleeve for support.  The patient has been appropriately medically screened and/or stabilized in the ED. I have low suspicion for any other emergent medical condition which would require further screening, evaluation or treatment in the ED or require inpatient management.  Patient is  hemodynamically stable and in no acute distress.  Patient able to ambulate in department prior to ED.  Evaluation does not show acute pathology that would require ongoing or additional emergent interventions while in the emergency department or further inpatient treatment.  I have discussed the diagnosis with the patient and answered all questions.  Pain is been managed while in the emergency department and patient has no further complaints prior to discharge.  Patient is comfortable with plan discussed in room and is stable for discharge at this time.  I have discussed strict return precautions for returning to the emergency department.  Patient was encouraged to follow-up with PCP/specialist refer to at discharge.     MDM Rules/Calculators/A&P                           Final Clinical Impression(s) / ED Diagnoses Final diagnoses:  Right knee pain, unspecified chronicity  Right foot pain    Rx / DC Orders ED Discharge Orders          Ordered    meloxicam (MOBIC) 7.5 MG tablet  Daily        10/07/20 1456    lidocaine (LIDODERM) 5 %  Every 24 hours        10/07/20 1456             Natalee Tomkiewicz A, PA-C 10/07/20 1459    Arby Barrette, MD 10/16/20 [phone]

## 2020-10-07 NOTE — Discharge Instructions (Addendum)
Your x-rays here today did not show any evidence of broken bones or dislocations  I have started on anti-inflammatory medication as well as some lidocaine pain patches.  Follow-up with orthopedics  Return for new or worsening symptoms

## 2020-10-07 NOTE — ED Triage Notes (Signed)
Pt reports hx of right knee pain, fell two weeks ago and having increase in pain. Dropped table on right foot and now has swelling and pain to her foot.

## 2022-08-24 ENCOUNTER — Institutional Professional Consult (permissible substitution): Payer: Medicaid Other | Admitting: Nurse Practitioner

## 2022-09-07 ENCOUNTER — Encounter: Payer: Self-pay | Admitting: Nurse Practitioner

## 2022-09-07 ENCOUNTER — Ambulatory Visit (INDEPENDENT_AMBULATORY_CARE_PROVIDER_SITE_OTHER): Payer: Medicaid Other | Admitting: Nurse Practitioner

## 2022-09-07 VITALS — BP 144/90 | HR 79 | Temp 98.4°F | Ht 71.0 in | Wt 279.0 lb

## 2022-09-07 DIAGNOSIS — Z6838 Body mass index (BMI) 38.0-38.9, adult: Secondary | ICD-10-CM

## 2022-09-07 DIAGNOSIS — E6609 Other obesity due to excess calories: Secondary | ICD-10-CM

## 2022-09-07 DIAGNOSIS — R0683 Snoring: Secondary | ICD-10-CM

## 2022-09-07 DIAGNOSIS — G4719 Other hypersomnia: Secondary | ICD-10-CM | POA: Diagnosis not present

## 2022-09-07 NOTE — Patient Instructions (Signed)
Given your symptoms, I am concerned that you may have sleep disordered breathing with sleep apnea. You will need a sleep study for further evaluation. Someone will contact you to schedule this.   We discussed how untreated sleep apnea puts an individual at risk for cardiac arrhthymias, pulm HTN, DM, stroke and increases their risk for daytime accidents. We also briefly reviewed treatment options including weight loss, side sleeping position, oral appliance, CPAP therapy or referral to ENT for possible surgical options  Use caution when driving and pull over if you become sleepy.  Follow up in 6 weeks with Katie Carolan Avedisian,NP to go over sleep study results, or sooner, if needed   

## 2022-09-07 NOTE — Progress Notes (Unsigned)
@Patient  ID: Evelyn Olsen, female    DOB: [birth year], 42 y.o.   MRN: [id number]  Chief Complaint  Patient presents with   Consult    Loud snoring, 3-4 times waking up, daytime sleepiness, pt has woke up choking,     Referring provider: Porfirio Oar, PA  HPI: 42 year old female, never smoker referred for sleep consult. Past medical history significant for HTN, depression, allergies.   TEST/EVENTS:   09/07/2022: Today - sleep consult Patient presents today for sleep consult, referred by Porfirio Oar, PA.  She feels tired all the time. Wakes up feeling poorly rested. Her children tell her that she snores really loudly and mouth breathes a lot. She also has been told that she drools a lot when she sleeps.Wakes up with morning headache and a dry mouth Wakes up frequently throughout the night. She's felt like she's woken up choking before. She does not drive. No sleep parasomnias/paralysis. No history of narcolepsy or symptoms of cataplexy.  Goes to bed between 9-10 pm. Falls asleep within 20 minutes. Wakes 4 or more times a night. Gets up around 4 am. 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 Weight has fluctuated 5-10 pounds over the last two years. No sleep aids. Never had a previous sleep study She has a history of hypertension, controlled with antihypertensives. No history of stroke or DM. She is a never smoker. Does not drink alcohol. Occasionally has coffee in the morning. Lives with her two children. Currently unemployed. Family history of seasonal allergies.   Epworth 15  Allergies  Allergen Reactions   Penicillins Hives    Has patient had a PCN reaction causing immediate rash, facial/tongue/throat swelling, SOB or lightheadedness with hypotension: Yes Has patient had a PCN reaction causing severe rash involving mucus membranes or skin necrosis: No Has patient had a PCN reaction that required hospitalization: No Has patient had a PCN reaction occurring within the last 10 years:  No If all of the above answers are "NO", then may proceed with Cephalosporin use.     Immunization History  Administered Date(s) Administered   Tdap 08/17/2015    Past Medical History:  Diagnosis Date   Arthritis    Seasonal allergies     Tobacco History: Social History   Tobacco Use  Smoking Status Never  Smokeless Tobacco Not on file   Counseling given: Not Answered   Outpatient Medications Prior to Visit  Medication Sig Dispense Refill   albuterol (PROAIR HFA) 108 (90 BASE) MCG/ACT inhaler Inhale 2 puffs into the lungs 2 (two) times daily as needed for wheezing or shortness of breath.     Ascorbic Acid (VITAMIN C) POWD Take 5 g by mouth daily.     aspirin-acetaminophen-caffeine (EXCEDRIN MIGRAINE) 250-250-65 MG per tablet Take 2 tablets by mouth 2 (two) times daily as needed (pain).     citalopram (CELEXA) 20 MG tablet Take 1 tablet (20 mg total) by mouth daily. 30 tablet 11   diphenhydrAMINE (BENADRYL) 12.5 MG/5ML liquid Take 25 mg by mouth 4 (four) times daily as needed (for allergic reactions).     DM-Doxylamine-Acetaminophen (NYQUIL COLD & FLU PO) Take 2 capsules by mouth every 6 (six) hours as needed (for cold and/or flu-like symptoms).      fexofenadine (ALLEGRA) 180 MG tablet Take 180 mg by mouth daily.     fluticasone (FLONASE) 50 MCG/ACT nasal spray Place 2 sprays into both nostrils daily.     HYDROcodone-acetaminophen (NORCO/VICODIN) 5-325 MG tablet Take 1 tablet by  mouth every 6 (six) hours as needed for moderate pain. 8 tablet 0   lidocaine (LIDODERM) 5 % Place 1 patch onto the skin daily. Remove & Discard patch within 12 hours or as directed by MD 30 patch 0   loratadine (CLARITIN) 10 MG tablet Take 1 tablet (10 mg total) by mouth daily. 30 tablet 0   Multiple Vitamin (MULTIVITAMIN WITH MINERALS) TABS Take 1 tablet by mouth daily.     Multiple Vitamins-Minerals (ALIVE WOMENS GUMMY) CHEW Chew 1 tablet by mouth daily.     Olopatadine HCl (PATADAY) 0.2 % SOLN  Place 1 drop into both eyes daily.      predniSONE (STERAPRED UNI-PAK 21 TAB) 10 MG (21) TBPK tablet Take by mouth daily. Take 4  tabs by mouth daily for 3 days, then 3 tabs for 2 days, then 2 tabs for 2 days, then 1 tabs for 2 days, 24 tablet 0   ranitidine (ZANTAC) 150 MG capsule Take 1 capsule (150 mg total) by mouth daily. 30 capsule 0   etonogestrel-ethinyl estradiol (NUVARING) 0.12-0.015 MG/24HR vaginal ring Insert vaginally and leave in place for 3 consecutive weeks, then remove for 1 week. (Patient not taking: Reported on 06/23/2017) 1 each 12   ibuprofen (ADVIL,MOTRIN) 800 MG tablet Take 1 tablet (800 mg total) by mouth every 8 (eight) hours as needed for mild pain. (Patient not taking: Reported on 06/23/2017) 30 tablet 0   No facility-administered medications prior to visit.     Review of Systems:   Constitutional: No weight loss or gain, night sweats, fevers, chills, or lassitude. +excessive fatigue  HEENT: No difficulty swallowing, tooth/dental problems, or sore throat. No itching, ear ache, post nasal drip. +morning headaches, occasional allergy symptoms  CV:  No chest pain, orthopnea, PND, swelling in lower extremities, anasarca, dizziness, palpitations, syncope Resp: +snoring, shortness of breath with exertion (strenuous; baseline); occasional allergy induced cough. No excess mucus or change in color of mucus. No hemoptysis. No wheezing.  No chest wall deformity GI:  +occasional heartburn, indigestion. No abdominal pain, nausea, vomiting, diarrhea, change in bowel habits, loss of appetite, bloody stools.  GU: No dysuria, change in color of urine, urgency or frequency.  No flank pain, no hematuria  Skin: No rash, lesions, ulcerations MSK:  No joint pain or swelling.   Neuro: No dizziness or lightheadedness.  Psych: No depression or anxiety. Mood stable. +sleep disturbance    Physical Exam:  BP (!) 144/90   Pulse 79   Temp 98.4 F (36.9 C) (Oral)   Ht 5\' 11"  (1.803 m)   Wt  279 lb (126.6 kg)   SpO2 100%   BMI 38.91 kg/m   GEN: Pleasant, interactive, well-appearing; obese; in no acute distress. HEENT:  Normocephalic and atraumatic. PERRLA. Sclera white. Nasal turbinates pink, moist and patent bilaterally. No rhinorrhea present. Oropharynx pink and moist, without exudate or edema. No lesions, ulcerations, or postnasal drip. Mallampati III NECK:  Supple w/ fair ROM. No JVD present. Normal carotid impulses w/o bruits. Thyroid symmetrical with no goiter or nodules palpated. No lymphadenopathy.   CV: RRR, no m/r/g, no peripheral edema. Pulses intact, +2 bilaterally. No cyanosis, pallor or clubbing. PULMONARY:  Unlabored, regular breathing. Clear bilaterally A&P w/o wheezes/rales/rhonchi. No accessory muscle use.  GI: BS present and normoactive. Soft, non-tender to palpation. No organomegaly or masses detected.  MSK: No erythema, warmth or tenderness. Cap refil <2 sec all extrem. No deformities or joint swelling noted.  Neuro: A/Ox3. No focal deficits noted.  Skin: Warm, no lesions or rashe Psych: Normal affect and behavior. Judgement and thought content appropriate.     Lab Results:  CBC    Component Value Date/Time   WBC 8.2 05/07/2015 1100   RBC 4.27 05/07/2015 1100   HGB 12.0 05/07/2015 1100   HCT 37.2 05/07/2015 1100   PLT 296 05/07/2015 1100   MCV 87.1 05/07/2015 1100   MCH 28.1 05/07/2015 1100   MCHC 32.3 05/07/2015 1100   RDW 14.5 05/07/2015 1100   LYMPHSABS 0.9 04/13/2008 0520   MONOABS 1.0 04/13/2008 0520   EOSABS 0.0 04/13/2008 0520   BASOSABS 0.0 04/13/2008 0520    BMET    Component Value Date/Time   NA 140 05/07/2015 1100   K 3.7 05/07/2015 1100   CL 104 05/07/2015 1100   CO2 26 05/07/2015 1100   GLUCOSE 81 05/07/2015 1100   BUN 14 05/07/2015 1100   CREATININE 1.03 (H) 05/07/2015 1100   CREATININE 1.08 12/28/2013 1045   CALCIUM 9.1 05/07/2015 1100   GFRNONAA >60 05/07/2015 1100   GFRAA >60 05/07/2015 1100    BNP No results  found for: "BNP"   Imaging:  No results found.        No data to display          No results found for: "NITRICOXIDE"      Assessment & Plan:   Excessive daytime sleepiness She has snoring, excessive daytime sleepiness, restless sleep, morning headaches. BMI 38.9. History of HTN. Epworth 15. Given this,  I am concerned she could have sleep disordered breathing with obstructive sleep apnea. She will need sleep study for further evaluation.    - discussed how weight can impact sleep and risk for sleep disordered breathing - discussed options to assist with weight loss: combination of diet modification, cardiovascular and strength training exercises   - had an extensive discussion regarding the adverse health consequences related to untreated sleep disordered breathing - specifically discussed the risks for hypertension, coronary artery disease, cardiac dysrhythmias, cerebrovascular disease, and diabetes - lifestyle modification discussed   - discussed how sleep disruption can increase risk of accidents, particularly when driving - safe driving practices were discussed  Patient Instructions  Given your symptoms, I am concerned that you may have sleep disordered breathing with sleep apnea. You will need a sleep study for further evaluation. Someone will contact you to schedule this.   We discussed how untreated sleep apnea puts an individual at risk for cardiac arrhthymias, pulm HTN, DM, stroke and increases their risk for daytime accidents. We also briefly reviewed treatment options including weight loss, side sleeping position, oral appliance, CPAP therapy or referral to ENT for possible surgical options  Use caution when driving and pull over if you become sleepy.  Follow up in 6 weeks with Katie Riese Hellard,NP to go over sleep study results, or sooner, if needed     Loud snoring See above  Class 2 obesity due to excess calories with body mass index (BMI) of 38.0 to 38.9 in  adult BMI 38.9. healthy weight loss encouraged.    I spent 35 minutes of dedicated to the care of this patient on the date of this encounter to include pre-visit review of records, face-to-face time with the patient discussing conditions above, post visit ordering of testing, clinical documentation with the electronic health record, making appropriate referrals as documented, and communicating necessary findings to members of the patients care team.  Noemi Chapel, NP 09/09/2022  Pt aware and understands NP's  role.

## 2022-09-09 ENCOUNTER — Encounter: Payer: Self-pay | Admitting: Nurse Practitioner

## 2022-09-09 ENCOUNTER — Telehealth: Payer: Self-pay | Admitting: Nurse Practitioner

## 2022-09-09 DIAGNOSIS — G4719 Other hypersomnia: Secondary | ICD-10-CM | POA: Insufficient documentation

## 2022-09-09 DIAGNOSIS — R0683 Snoring: Secondary | ICD-10-CM | POA: Insufficient documentation

## 2022-09-09 DIAGNOSIS — E6609 Other obesity due to excess calories: Secondary | ICD-10-CM | POA: Insufficient documentation

## 2022-09-09 NOTE — Telephone Encounter (Signed)
Called pt and left vm to call me back.   PT is OON with snap

## 2022-09-09 NOTE — Assessment & Plan Note (Signed)
See above

## 2022-09-09 NOTE — Assessment & Plan Note (Signed)
She has snoring, excessive daytime sleepiness, restless sleep, morning headaches. BMI 38.9. History of HTN. Epworth 15. Given this,  I am concerned she could have sleep disordered breathing with obstructive sleep apnea. She will need sleep study for further evaluation.    - discussed how weight can impact sleep and risk for sleep disordered breathing - discussed options to assist with weight loss: combination of diet modification, cardiovascular and strength training exercises   - had an extensive discussion regarding the adverse health consequences related to untreated sleep disordered breathing - specifically discussed the risks for hypertension, coronary artery disease, cardiac dysrhythmias, cerebrovascular disease, and diabetes - lifestyle modification discussed   - discussed how sleep disruption can increase risk of accidents, particularly when driving - safe driving practices were discussed  Patient Instructions  Given your symptoms, I am concerned that you may have sleep disordered breathing with sleep apnea. You will need a sleep study for further evaluation. Someone will contact you to schedule this.   We discussed how untreated sleep apnea puts an individual at risk for cardiac arrhthymias, pulm HTN, DM, stroke and increases their risk for daytime accidents. We also briefly reviewed treatment options including weight loss, side sleeping position, oral appliance, CPAP therapy or referral to ENT for possible surgical options  Use caution when driving and pull over if you become sleepy.  Follow up in 6 weeks with Evelyn Nikayla Madaris,NP to go over sleep study results, or sooner, if needed

## 2022-09-09 NOTE — Assessment & Plan Note (Signed)
BMI 38.9. healthy weight loss encouraged.

## 2022-09-09 NOTE — Telephone Encounter (Signed)
Patient would like HST mailed to her due to lack of transportation. Please call patient to discuss maybe sending through [facility].

## 2022-09-09 NOTE — Telephone Encounter (Signed)
Spoke to pt.  Pt needed to set a date to return the device to the office if she picks up the device on 09/17/22.  Pt will return the device to our office 09/21/22.  Nothing further needed at this time.
